# Patient Record
Sex: Female | Born: 1965 | Race: White | Hispanic: No | Smoking: Current every day smoker
Health system: Southern US, Community
[De-identification: ages and names within clinical notes are randomized; demographics above are authoritative.]

## PROBLEM LIST (undated history)

## (undated) DIAGNOSIS — F319 Bipolar disorder, unspecified: Secondary | ICD-10-CM

## (undated) DIAGNOSIS — E039 Hypothyroidism, unspecified: Secondary | ICD-10-CM

## (undated) DIAGNOSIS — F329 Major depressive disorder, single episode, unspecified: Secondary | ICD-10-CM

## (undated) DIAGNOSIS — K219 Gastro-esophageal reflux disease without esophagitis: Secondary | ICD-10-CM

## (undated) DIAGNOSIS — I10 Essential (primary) hypertension: Secondary | ICD-10-CM

## (undated) DIAGNOSIS — T8859XA Other complications of anesthesia, initial encounter: Secondary | ICD-10-CM

## (undated) DIAGNOSIS — C801 Malignant (primary) neoplasm, unspecified: Secondary | ICD-10-CM

## (undated) DIAGNOSIS — G5600 Carpal tunnel syndrome, unspecified upper limb: Secondary | ICD-10-CM

## (undated) DIAGNOSIS — C220 Liver cell carcinoma: Secondary | ICD-10-CM

## (undated) DIAGNOSIS — F419 Anxiety disorder, unspecified: Secondary | ICD-10-CM

## (undated) DIAGNOSIS — J449 Chronic obstructive pulmonary disease, unspecified: Secondary | ICD-10-CM

## (undated) DIAGNOSIS — R112 Nausea with vomiting, unspecified: Secondary | ICD-10-CM

## (undated) DIAGNOSIS — Z9889 Other specified postprocedural states: Secondary | ICD-10-CM

## (undated) DIAGNOSIS — F32A Depression, unspecified: Secondary | ICD-10-CM

## (undated) DIAGNOSIS — T4145XA Adverse effect of unspecified anesthetic, initial encounter: Secondary | ICD-10-CM

## (undated) DIAGNOSIS — F4310 Post-traumatic stress disorder, unspecified: Secondary | ICD-10-CM

## (undated) DIAGNOSIS — T300 Burn of unspecified body region, unspecified degree: Secondary | ICD-10-CM

## (undated) HISTORY — PX: LUNG SURGERY: SHX703

## (undated) HISTORY — PX: PORTA CATH INSERTION: CATH118285

## (undated) HISTORY — PX: OTHER SURGICAL HISTORY: SHX169

## (undated) HISTORY — DX: Chronic obstructive pulmonary disease, unspecified: J44.9

## (undated) HISTORY — DX: Gastro-esophageal reflux disease without esophagitis: K21.9

---

## 1999-03-19 ENCOUNTER — Encounter: Admission: RE | Admit: 1999-03-19 | Discharge: 1999-03-19 | Payer: Self-pay | Admitting: Family Medicine

## 1999-03-24 ENCOUNTER — Emergency Department (HOSPITAL_COMMUNITY): Admission: EM | Admit: 1999-03-24 | Discharge: 1999-03-25 | Payer: Self-pay | Admitting: *Deleted

## 1999-08-30 ENCOUNTER — Encounter: Admission: RE | Admit: 1999-08-30 | Discharge: 1999-08-30 | Payer: Self-pay | Admitting: Family Medicine

## 2000-11-18 ENCOUNTER — Emergency Department (HOSPITAL_COMMUNITY): Admission: EM | Admit: 2000-11-18 | Discharge: 2000-11-18 | Payer: Self-pay | Admitting: Emergency Medicine

## 2000-11-18 ENCOUNTER — Encounter: Payer: Self-pay | Admitting: Emergency Medicine

## 2000-12-11 ENCOUNTER — Encounter: Admission: RE | Admit: 2000-12-11 | Discharge: 2000-12-11 | Payer: Self-pay | Admitting: Family Medicine

## 2000-12-25 ENCOUNTER — Encounter: Admission: RE | Admit: 2000-12-25 | Discharge: 2000-12-25 | Payer: Self-pay | Admitting: Family Medicine

## 2001-06-08 ENCOUNTER — Emergency Department (HOSPITAL_COMMUNITY): Admission: EM | Admit: 2001-06-08 | Discharge: 2001-06-08 | Payer: Self-pay | Admitting: Emergency Medicine

## 2001-06-08 ENCOUNTER — Encounter: Payer: Self-pay | Admitting: Emergency Medicine

## 2001-06-10 ENCOUNTER — Encounter: Admission: RE | Admit: 2001-06-10 | Discharge: 2001-06-10 | Payer: Self-pay | Admitting: Family Medicine

## 2001-06-17 ENCOUNTER — Encounter: Admission: RE | Admit: 2001-06-17 | Discharge: 2001-06-17 | Payer: Self-pay | Admitting: Family Medicine

## 2001-12-13 ENCOUNTER — Encounter: Payer: Self-pay | Admitting: Emergency Medicine

## 2001-12-13 ENCOUNTER — Emergency Department (HOSPITAL_COMMUNITY): Admission: EM | Admit: 2001-12-13 | Discharge: 2001-12-13 | Payer: Self-pay | Admitting: Emergency Medicine

## 2003-06-10 ENCOUNTER — Emergency Department (HOSPITAL_COMMUNITY): Admission: EM | Admit: 2003-06-10 | Discharge: 2003-06-10 | Payer: Self-pay | Admitting: Emergency Medicine

## 2003-07-21 ENCOUNTER — Emergency Department (HOSPITAL_COMMUNITY): Admission: EM | Admit: 2003-07-21 | Discharge: 2003-07-21 | Payer: Self-pay | Admitting: Emergency Medicine

## 2003-08-06 ENCOUNTER — Emergency Department (HOSPITAL_COMMUNITY): Admission: EM | Admit: 2003-08-06 | Discharge: 2003-08-06 | Payer: Self-pay | Admitting: Emergency Medicine

## 2006-12-19 ENCOUNTER — Emergency Department (HOSPITAL_COMMUNITY): Admission: EM | Admit: 2006-12-19 | Discharge: 2006-12-19 | Payer: Self-pay | Admitting: Emergency Medicine

## 2007-01-13 ENCOUNTER — Emergency Department (HOSPITAL_COMMUNITY): Admission: EM | Admit: 2007-01-13 | Discharge: 2007-01-13 | Payer: Self-pay | Admitting: Emergency Medicine

## 2007-07-22 ENCOUNTER — Emergency Department (HOSPITAL_COMMUNITY): Admission: EM | Admit: 2007-07-22 | Discharge: 2007-07-22 | Payer: Self-pay | Admitting: Emergency Medicine

## 2008-05-18 ENCOUNTER — Emergency Department (HOSPITAL_COMMUNITY): Admission: EM | Admit: 2008-05-18 | Discharge: 2008-05-18 | Payer: Self-pay | Admitting: Emergency Medicine

## 2008-05-22 ENCOUNTER — Emergency Department (HOSPITAL_COMMUNITY): Admission: EM | Admit: 2008-05-22 | Discharge: 2008-05-22 | Payer: Self-pay | Admitting: Emergency Medicine

## 2008-11-10 ENCOUNTER — Emergency Department (HOSPITAL_COMMUNITY): Admission: EM | Admit: 2008-11-10 | Discharge: 2008-11-10 | Payer: Self-pay | Admitting: Emergency Medicine

## 2009-03-16 ENCOUNTER — Emergency Department (HOSPITAL_COMMUNITY): Admission: EM | Admit: 2009-03-16 | Discharge: 2009-03-16 | Payer: Self-pay | Admitting: Emergency Medicine

## 2010-04-26 ENCOUNTER — Emergency Department (HOSPITAL_COMMUNITY): Admission: EM | Admit: 2010-04-26 | Discharge: 2010-04-26 | Payer: Self-pay | Admitting: Emergency Medicine

## 2010-05-24 ENCOUNTER — Emergency Department (HOSPITAL_COMMUNITY): Admission: EM | Admit: 2010-05-24 | Discharge: 2010-05-24 | Payer: Self-pay | Admitting: Emergency Medicine

## 2010-10-23 LAB — URINALYSIS, ROUTINE W REFLEX MICROSCOPIC
Bilirubin Urine: NEGATIVE
Glucose, UA: NEGATIVE mg/dL
Urobilinogen, UA: 0.2 mg/dL (ref 0.0–1.0)

## 2010-10-23 LAB — CBC
MCH: 29 pg (ref 26.0–34.0)
MCHC: 32.6 g/dL (ref 30.0–36.0)
MCV: 89 fL (ref 78.0–100.0)
Platelets: 314 10*3/uL (ref 150–400)
RBC: 4.58 MIL/uL (ref 3.87–5.11)
WBC: 6.4 10*3/uL (ref 4.0–10.5)

## 2010-10-23 LAB — CLOSTRIDIUM DIFFICILE EIA: C difficile Toxins A+B, EIA: NEGATIVE

## 2010-10-23 LAB — LIPASE, BLOOD: Lipase: 25 U/L (ref 11–59)

## 2010-10-23 LAB — COMPREHENSIVE METABOLIC PANEL
ALT: 11 U/L (ref 0–35)
AST: 19 U/L (ref 0–37)
BUN: 7 mg/dL (ref 6–23)
CO2: 23 mEq/L (ref 19–32)
GFR calc non Af Amer: >60 mL/min (ref >60)
Glucose, Bld: 90 mg/dL (ref 70–99)
Potassium: 3.7 mEq/L (ref 3.5–5.1)

## 2010-10-23 LAB — DIFFERENTIAL
Basophils Absolute: 0 10*3/uL (ref 0.0–0.1)
Basophils Relative: 0 % (ref 0–1)
Eosinophils Absolute: 0.1 10*3/uL (ref 0.0–0.7)
Eosinophils Relative: 1 % (ref 0–5)
Lymphocytes Relative: 10 % — ABNORMAL LOW (ref 12–46)
Monocytes Relative: 6 % (ref 3–12)
Neutro Abs: 5.3 10*3/uL (ref 1.7–7.7)

## 2010-10-23 LAB — STOOL CULTURE

## 2010-10-23 LAB — OVA AND PARASITE EXAMINATION

## 2010-10-23 LAB — URINE CULTURE

## 2010-11-14 ENCOUNTER — Emergency Department (HOSPITAL_COMMUNITY): Payer: Self-pay

## 2010-11-14 ENCOUNTER — Emergency Department (HOSPITAL_COMMUNITY)
Admission: EM | Admit: 2010-11-14 | Discharge: 2010-11-14 | Disposition: A | Discharge status: Home / Self Care | Payer: Self-pay | Attending: Emergency Medicine | Admitting: Emergency Medicine

## 2010-11-14 DIAGNOSIS — R071 Chest pain on breathing: Secondary | ICD-10-CM | POA: Insufficient documentation

## 2010-11-14 DIAGNOSIS — Y939 Activity, unspecified: Secondary | ICD-10-CM | POA: Insufficient documentation

## 2010-11-14 DIAGNOSIS — W010XXA Fall on same level from slipping, tripping and stumbling without subsequent striking against object, initial encounter: Secondary | ICD-10-CM | POA: Insufficient documentation

## 2010-11-14 DIAGNOSIS — Y92009 Unspecified place in unspecified non-institutional (private) residence as the place of occurrence of the external cause: Secondary | ICD-10-CM | POA: Insufficient documentation

## 2010-11-14 DIAGNOSIS — I10 Essential (primary) hypertension: Secondary | ICD-10-CM | POA: Insufficient documentation

## 2010-11-14 DIAGNOSIS — F17200 Nicotine dependence, unspecified, uncomplicated: Secondary | ICD-10-CM | POA: Insufficient documentation

## 2010-11-14 DIAGNOSIS — J45909 Unspecified asthma, uncomplicated: Secondary | ICD-10-CM | POA: Insufficient documentation

## 2010-11-14 DIAGNOSIS — Y998 Other external cause status: Secondary | ICD-10-CM | POA: Insufficient documentation

## 2010-11-14 DIAGNOSIS — S20219A Contusion of unspecified front wall of thorax, initial encounter: Secondary | ICD-10-CM | POA: Insufficient documentation

## 2011-05-12 LAB — CBC
HCT: 36.3
Hemoglobin: 12.6
MCHC: 34.7
MCV: 97.7
RBC: 3.72 — ABNORMAL LOW
RDW: 13.2
WBC: 6

## 2011-05-12 LAB — COMPREHENSIVE METABOLIC PANEL
ALT: 13
AST: 18
Chloride: 107
GFR calc Af Amer: >60
GFR calc non Af Amer: >60
Potassium: 3.5
Total Protein: 6.3

## 2011-05-12 LAB — DIFFERENTIAL
Basophils Absolute: 0
Eosinophils Absolute: 0
Eosinophils Relative: 0
Lymphocytes Relative: 11 — ABNORMAL LOW
Neutro Abs: 4.9

## 2011-05-12 LAB — URINALYSIS, ROUTINE W REFLEX MICROSCOPIC
Bilirubin Urine: NEGATIVE
Hgb urine dipstick: NEGATIVE
Ketones, ur: 15 — AB
Nitrite: NEGATIVE
Protein, ur: NEGATIVE
pH: 6.5

## 2011-05-12 LAB — APTT: aPTT: 30

## 2011-09-25 ENCOUNTER — Emergency Department (HOSPITAL_COMMUNITY)
Admission: EM | Admit: 2011-09-25 | Discharge: 2011-09-25 | Disposition: A | Discharge status: Home / Self Care | Payer: Self-pay | Attending: Emergency Medicine | Admitting: Emergency Medicine

## 2011-09-25 ENCOUNTER — Encounter (HOSPITAL_COMMUNITY): Payer: Self-pay | Admitting: *Deleted

## 2011-09-25 DIAGNOSIS — M549 Dorsalgia, unspecified: Secondary | ICD-10-CM | POA: Insufficient documentation

## 2011-09-25 DIAGNOSIS — F17200 Nicotine dependence, unspecified, uncomplicated: Secondary | ICD-10-CM | POA: Insufficient documentation

## 2011-09-25 DIAGNOSIS — I10 Essential (primary) hypertension: Secondary | ICD-10-CM | POA: Insufficient documentation

## 2011-09-25 HISTORY — DX: Essential (primary) hypertension: I10

## 2011-09-25 LAB — URINALYSIS, ROUTINE W REFLEX MICROSCOPIC
Ketones, ur: NEGATIVE mg/dL
Nitrite: NEGATIVE
Protein, ur: NEGATIVE mg/dL
Urobilinogen, UA: 0.2 mg/dL (ref 0.0–1.0)

## 2011-09-25 MED ORDER — ONDANSETRON HCL 4 MG PO TABS
4.0000 mg | ORAL_TABLET | Freq: Once | ORAL | Status: AC
  Administered 2011-09-25: 4 mg via ORAL
  Filled 2011-09-25: qty 1

## 2011-09-25 MED ORDER — HYDROCODONE-ACETAMINOPHEN 5-325 MG PO TABS
1.0000 | ORAL_TABLET | Freq: Once | ORAL | Status: AC
  Administered 2011-09-25: 1 via ORAL
  Filled 2011-09-25: qty 1

## 2011-09-25 MED ORDER — PREDNISONE 20 MG PO TABS
60.0000 mg | ORAL_TABLET | Freq: Once | ORAL | Status: AC
  Administered 2011-09-25: 60 mg via ORAL
  Filled 2011-09-25: qty 3

## 2011-09-25 MED ORDER — HYDROCODONE-ACETAMINOPHEN 5-325 MG PO TABS: 1.0000 | ORAL_TABLET | ORAL | Status: AC | PRN

## 2011-09-25 MED ORDER — CYCLOBENZAPRINE HCL 10 MG PO TABS: 10.0000 mg | ORAL_TABLET | Freq: Two times a day (BID) | ORAL | Status: AC | PRN

## 2011-09-25 MED ORDER — METHOCARBAMOL 500 MG PO TABS
1000.0000 mg | ORAL_TABLET | Freq: Once | ORAL | Status: AC
  Administered 2011-09-25: 1000 mg via ORAL
  Filled 2011-09-25: qty 2

## 2011-09-25 MED ORDER — DEXAMETHASONE 4 MG PO TABS: ORAL_TABLET | ORAL | Status: AC

## 2011-09-25 NOTE — ED Notes (Signed)
Low backpain, no known injury  Increased pain with movement.
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
## 2011-09-25 NOTE — Discharge Instructions (Signed)
Please use the medications as prescribed. Please see Dr Shon Baton for evaluation and management of your back if not improving. Cyclobenzaprine and Norco may cause drowsiness, use with caution.Back Pain, Adult Low back pain is very common. About 1 in 5 people have back pain.The cause of low back pain is rarely dangerous. The pain often gets better over time.About half of people with a sudden onset of back pain feel better in just 2 weeks. About 8 in 10 people feel better by 6 weeks.  CAUSES Some common causes of back pain include:  Strain of the muscles or ligaments supporting the spine.   Wear and tear (degeneration) of the spinal discs.   Arthritis.   Direct injury to the back.  DIAGNOSIS Most of the time, the direct cause of low back pain is not known.However, back pain can be treated effectively even when the exact cause of the pain is unknown.Answering your caregiver's questions about your overall health and symptoms is one of the most accurate ways to make sure the cause of your pain is not dangerous. If your caregiver needs more information, he or she may order lab work or imaging tests (X-rays or MRIs).However, even if imaging tests show changes in your back, this usually does not require surgery. HOME CARE INSTRUCTIONS For many people, back pain returns.Since low back pain is rarely dangerous, it is often a condition that people can learn to [facility] their own.   Remain active. It is stressful on the back to sit or stand in one place. Do not sit, drive, or stand in one place for more than 30 minutes at a time. Take short walks on level surfaces as soon as pain allows.Try to increase the length of time you walk each day.   Do not stay in bed.Resting more than 1 or 2 days can delay your recovery.   Do not avoid exercise or work.Your body is made to move.It is not dangerous to be active, even though your back may hurt.Your back will likely heal faster if you return to being active  before your pain is gone.   Pay attention to your body when you bend and lift. Many people have less discomfortwhen lifting if they bend their knees, keep the load close to their bodies,and avoid twisting. Often, the most comfortable positions are those that put less stress on your recovering back.   Find a comfortable position to sleep. Use a firm mattress and lie on your side with your knees slightly bent. If you lie on your back, put a pillow under your knees.   Only take over-the-counter or prescription medicines as directed by your caregiver. Over-the-counter medicines to reduce pain and inflammation are often the most helpful.Your caregiver may prescribe muscle relaxant drugs.These medicines help dull your pain so you can more quickly return to your normal activities and healthy exercise.   Put ice on the injured area.   Put ice in a plastic bag.   Place a towel between your skin and the bag.   Leave the ice on for 15 to 20 minutes, 3 to 4 times a day for the first 2 to 3 days. After that, ice and heat may be alternated to reduce pain and spasms.   Ask your caregiver about trying back exercises and gentle massage. This may be of some benefit.   Avoid feeling anxious or stressed.Stress increases muscle tension and can worsen back pain.It is important to recognize when you are anxious or stressed and learn ways  to manage it.Exercise is a great option.  SEEK MEDICAL CARE IF:  You have pain that is not relieved with rest or medicine.   You have pain that does not improve in 1 week.   You have new symptoms.   You are generally not feeling well.  SEEK IMMEDIATE MEDICAL CARE IF:   You have pain that radiates from your back into your legs.   You develop new bowel or bladder control problems.   You have unusual weakness or numbness in your arms or legs.   You develop nausea or vomiting.   You develop abdominal pain.   You feel faint.  Document Released: 07/28/2005  Document Revised: 04/09/2011 Document Reviewed: 12/16/2010 [facility] Patient Information 2012 [place], [place].

## 2011-09-25 NOTE — ED Notes (Signed)
Pt DC to home in the care of family 

## 2011-09-27 NOTE — ED Provider Notes (Signed)
Medical screening examination/treatment/procedure(s) were performed by non-physician practitioner and as supervising physician I was immediately available for consultation/collaboration.  Nicoletta Dress. Colon Branch, MD 09/27/11 0800

## 2012-04-11 ENCOUNTER — Emergency Department (HOSPITAL_COMMUNITY): Payer: Self-pay

## 2012-04-11 ENCOUNTER — Encounter (HOSPITAL_COMMUNITY): Payer: Self-pay | Admitting: Emergency Medicine

## 2012-04-11 ENCOUNTER — Emergency Department (HOSPITAL_COMMUNITY)
Admission: EM | Admit: 2012-04-11 | Discharge: 2012-04-11 | Disposition: A | Discharge status: Home / Self Care | Payer: Self-pay | Attending: Emergency Medicine | Admitting: Emergency Medicine

## 2012-04-11 DIAGNOSIS — R0602 Shortness of breath: Secondary | ICD-10-CM | POA: Insufficient documentation

## 2012-04-11 DIAGNOSIS — F17200 Nicotine dependence, unspecified, uncomplicated: Secondary | ICD-10-CM | POA: Insufficient documentation

## 2012-04-11 DIAGNOSIS — J3489 Other specified disorders of nose and nasal sinuses: Secondary | ICD-10-CM | POA: Insufficient documentation

## 2012-04-11 DIAGNOSIS — Z79899 Other long term (current) drug therapy: Secondary | ICD-10-CM | POA: Insufficient documentation

## 2012-04-11 DIAGNOSIS — I10 Essential (primary) hypertension: Secondary | ICD-10-CM | POA: Insufficient documentation

## 2012-04-11 DIAGNOSIS — R05 Cough: Secondary | ICD-10-CM | POA: Insufficient documentation

## 2012-04-11 DIAGNOSIS — J209 Acute bronchitis, unspecified: Secondary | ICD-10-CM | POA: Insufficient documentation

## 2012-04-11 DIAGNOSIS — J449 Chronic obstructive pulmonary disease, unspecified: Secondary | ICD-10-CM

## 2012-04-11 DIAGNOSIS — R079 Chest pain, unspecified: Secondary | ICD-10-CM | POA: Insufficient documentation

## 2012-04-11 DIAGNOSIS — R059 Cough, unspecified: Secondary | ICD-10-CM | POA: Insufficient documentation

## 2012-04-11 DIAGNOSIS — J45909 Unspecified asthma, uncomplicated: Secondary | ICD-10-CM | POA: Insufficient documentation

## 2012-04-11 MED ORDER — ALBUTEROL SULFATE HFA 108 (90 BASE) MCG/ACT IN AERS
2.0000 | INHALATION_SPRAY | RESPIRATORY_TRACT | Status: DC | PRN
  Administered 2012-04-11: 2 via RESPIRATORY_TRACT
  Filled 2012-04-11: qty 6.7

## 2012-04-11 MED ORDER — ALBUTEROL SULFATE (5 MG/ML) 0.5% IN NEBU
5.0000 mg | INHALATION_SOLUTION | Freq: Once | RESPIRATORY_TRACT | Status: AC
  Administered 2012-04-11: 5 mg via RESPIRATORY_TRACT
  Filled 2012-04-11: qty 1

## 2012-04-11 MED ORDER — AZITHROMYCIN 250 MG PO TABS
500.0000 mg | ORAL_TABLET | Freq: Once | ORAL | Status: AC
  Administered 2012-04-11: 500 mg via ORAL
  Filled 2012-04-11: qty 2

## 2012-04-11 MED ORDER — PREDNISONE 20 MG PO TABS: ORAL_TABLET | ORAL | Status: DC

## 2012-04-11 MED ORDER — PREDNISONE 20 MG PO TABS
60.0000 mg | ORAL_TABLET | Freq: Once | ORAL | Status: AC
  Administered 2012-04-11: 60 mg via ORAL
  Filled 2012-04-11: qty 3

## 2012-04-11 MED ORDER — AZITHROMYCIN 250 MG PO TABS: ORAL_TABLET | ORAL | Status: DC

## 2012-04-11 MED ORDER — IPRATROPIUM BROMIDE 0.02 % IN SOLN
0.5000 mg | Freq: Once | RESPIRATORY_TRACT | Status: AC
  Administered 2012-04-11: 0.5 mg via RESPIRATORY_TRACT
  Filled 2012-04-11: qty 2.5

## 2012-04-11 MED ORDER — ALBUTEROL SULFATE HFA 108 (90 BASE) MCG/ACT IN AERS: 1.0000 | INHALATION_SPRAY | Freq: Four times a day (QID) | RESPIRATORY_TRACT | Status: DC | PRN
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
## 2012-04-11 NOTE — ED Notes (Signed)
Pt States has had increasing cough, nasal congestion x 1 week. Presents today with bilateral chest pain secondary to inspiration. Pt's lung fields with audible rales throughout. RTT notified of neb treatment need. Pt's CXR pending. Pt is alert, oriented x 4, side rails up per safety. Family at bedside.

## 2012-04-11 NOTE — ED Notes (Addendum)
Pt c/o cough/chest congestion x 1 week. Pt also c/o cp with inspiration. Pt states she is out of here albuterol inhaler.

## 2014-04-15 ENCOUNTER — Emergency Department (HOSPITAL_COMMUNITY): Payer: Self-pay

## 2014-04-15 ENCOUNTER — Encounter (HOSPITAL_COMMUNITY): Payer: Self-pay | Admitting: Emergency Medicine

## 2014-04-15 ENCOUNTER — Emergency Department (HOSPITAL_COMMUNITY)
Admission: EM | Admit: 2014-04-15 | Discharge: 2014-04-15 | Disposition: A | Discharge status: Home / Self Care | Payer: Self-pay | Attending: Emergency Medicine | Admitting: Emergency Medicine

## 2014-04-15 DIAGNOSIS — Y9389 Activity, other specified: Secondary | ICD-10-CM | POA: Insufficient documentation

## 2014-04-15 DIAGNOSIS — S4980XA Other specified injuries of shoulder and upper arm, unspecified arm, initial encounter: Secondary | ICD-10-CM | POA: Insufficient documentation

## 2014-04-15 DIAGNOSIS — J45909 Unspecified asthma, uncomplicated: Secondary | ICD-10-CM | POA: Insufficient documentation

## 2014-04-15 DIAGNOSIS — S60219A Contusion of unspecified wrist, initial encounter: Secondary | ICD-10-CM | POA: Insufficient documentation

## 2014-04-15 DIAGNOSIS — IMO0002 Reserved for concepts with insufficient information to code with codable children: Secondary | ICD-10-CM | POA: Insufficient documentation

## 2014-04-15 DIAGNOSIS — Z79899 Other long term (current) drug therapy: Secondary | ICD-10-CM | POA: Insufficient documentation

## 2014-04-15 DIAGNOSIS — S46909A Unspecified injury of unspecified muscle, fascia and tendon at shoulder and upper arm level, unspecified arm, initial encounter: Secondary | ICD-10-CM | POA: Insufficient documentation

## 2014-04-15 DIAGNOSIS — S60211A Contusion of right wrist, initial encounter: Secondary | ICD-10-CM

## 2014-04-15 DIAGNOSIS — Y9289 Other specified places as the place of occurrence of the external cause: Secondary | ICD-10-CM | POA: Insufficient documentation

## 2014-04-15 DIAGNOSIS — F17200 Nicotine dependence, unspecified, uncomplicated: Secondary | ICD-10-CM | POA: Insufficient documentation

## 2014-04-15 DIAGNOSIS — Y990 Civilian activity done for income or pay: Secondary | ICD-10-CM | POA: Insufficient documentation

## 2014-04-15 DIAGNOSIS — I10 Essential (primary) hypertension: Secondary | ICD-10-CM | POA: Insufficient documentation

## 2014-04-15 MED ORDER — NAPROXEN 500 MG PO TABS: 500.0000 mg | ORAL_TABLET | Freq: Two times a day (BID) | ORAL | Status: DC

## 2014-04-15 MED ORDER — IBUPROFEN 800 MG PO TABS
800.0000 mg | ORAL_TABLET | Freq: Once | ORAL | Status: AC
  Administered 2014-04-15: 800 mg via ORAL
  Filled 2014-04-15: qty 1

## 2014-04-15 NOTE — ED Notes (Addendum)
Pain to right wrist for a few days.  Remember hitting her right hand at work in the past week.

## 2014-04-15 NOTE — Discharge Instructions (Signed)
Xray normal - bruising - use meds as prescribed  Please call your doctor for a followup appointment within 24-48 hours. When you talk to your doctor please let them know that you were seen in the emergency department and have them acquire all of your records so that they can discuss the findings with you and formulate a treatment plan to fully care for your new and ongoing problems.   [facility] Primary Care Doctor List    Sinda Du MD. Specialty: Pulmonary Disease Contact information: [address]  [place] [address]  [phone]   Tula Nakayama, MD. Specialty: [facility] Medicine Contact information: [address], [address] [place] [address]  [phone]   Sallee Lange, MD. Specialty: [facility] Medicine Contact information: [place]  [place] [address]  [phone]   Rosita Fire, MD Specialty: Internal Medicine Contact information: [place] [address]  [phone]   Delphina Cahill, MD. Specialty: Internal Medicine Contact information: [place] [address]  [phone]   Marjean Donna, MD. Specialty: Family Medicine Contact information: [place] [address]  [phone]   Leslie Andrea, MD. Specialty: [facility] Medicine Contact information: [place] [address]  [phone]   Asencion Noble, MD. Specialty: Internal Medicine Contact information: [place] [address]  [phone]

## 2014-04-15 NOTE — ED Provider Notes (Signed)
CSN: [id number]     Arrival date & time 04/15/14  1136 History   First MD Initiated Contact with Patient 04/15/14 1143     Chief Complaint  Patient presents with  . Arm Injury     (Consider location/radiation/quality/duration/timing/severity/associated sxs/prior Treatment) HPI Comments: R wrist pain onset several days ago when she states that she accidentally banged her wrist on a metal tray at work, delayed sweling and pain - worsening, no numbness but has tingling in the fingers especially the thumb.  Sx are constant.  Patient is a 48 y.o. female presenting with arm injury.  Arm Injury Associated symptoms: no back pain and no neck pain     Past Medical History  Diagnosis Date  . Hypertension   . Asthma    Past Surgical History  Procedure Laterality Date  . Cesarean section     History reviewed. No pertinent family history. History  Substance Use Topics  . Smoking status: Current Every Day Smoker    Types: Cigarettes  . Smokeless tobacco: Not on file  . Alcohol Use: No   OB History   Grav Para Term Preterm Abortions TAB SAB Ect Mult Living                 Review of Systems  Gastrointestinal: Negative for nausea and vomiting.  Musculoskeletal: Positive for joint swelling (R wrist). Negative for back pain and neck pain.  Neurological: Negative for weakness and numbness.      Allergies  Codeine  Home Medications   Prior to Admission medications   Medication Sig Start Date End Date Taking? Authorizing Provider  acetaminophen (PAIN RELIEVER) 325 MG tablet Take 325-650 mg by mouth every 6 (six) hours as needed. FOR PAIN    Historical Provider, MD  albuterol (PROVENTIL HFA;VENTOLIN HFA) 108 (90 BASE) MCG/ACT inhaler Inhale 1-2 puffs into the lungs every 6 (six) hours as needed for wheezing. 04/11/12 04/11/13  Mylinda Latina, MD  albuterol (VENTOLIN HFA) 108 (90 BASE) MCG/ACT inhaler Inhale 2 puffs into the lungs every 6 (six) hours as needed. FOR SHORTNESS OF BREATH     Historical Provider, MD  Aspirin-Acetaminophen-Caffeine (GOODY HEADACHE PO) Take 1 Package by mouth 2 (two) times daily as needed. FOR PAIN    Historical Provider, MD  azithromycin (ZITHROMAX) 250 MG tablet Take one tablet per day for the next 4 days. 04/11/12   Mylinda Latina, MD  ibuprofen (ADVIL,MOTRIN) 200 MG tablet Take 800 mg by mouth every 8 (eight) hours as needed. FOR PAIN    Historical Provider, MD  naproxen (NAPROSYN) 500 MG tablet Take 1 tablet (500 mg total) by mouth 2 (two) times daily with a meal. 04/15/14   Johnna Acosta, MD  predniSONE (DELTASONE) 20 MG tablet Take 3 tablets per day for 2 days, then 2 tablets per day for 2 days, then 1 tablet per day for 2 days. 04/11/12   Mylinda Latina, MD   BP 163/90  Pulse 78  Temp(Src) 98.1 F (36.7 C) (Oral)  Resp 18  Ht 4\' 11"  (1.499 m)  Wt 73 lb 2 oz (33.169 kg)  BMI 14.76 kg/m2  SpO2 98% Physical Exam  Nursing note and vitals reviewed. Constitutional: She appears well-developed and well-nourished. No distress.  HENT:  Head: Normocephalic and atraumatic.  Eyes: Conjunctivae are normal. No scleral icterus.  Cardiovascular: Normal rate, regular rhythm and intact distal pulses.   Pulmonary/Chest: Effort normal and breath sounds normal.  Musculoskeletal: She exhibits tenderness ( R wrist over radial surface). She  exhibits no edema.  Neurological: She is alert.  Skin: Skin is warm and dry. No rash noted. She is not diaphoretic.    ED Course  Procedures (including critical care time) Labs Review Labs Reviewed - No data to display  Imaging Review Dg Wrist Complete Right  04/15/2014   CLINICAL DATA:  Right wrist pain/injury  EXAM: RIGHT WRIST - COMPLETE 3+ VIEW  COMPARISON:  None.  FINDINGS: No fracture or dislocation is seen.  The joint spaces are preserved.  Mild positive ulnar variance.  The visualized soft tissues are unremarkable.  IMPRESSION: No fracture or dislocation is seen.   Electronically Signed   By: Julian Hy M.D.    On: 04/15/2014 12:25      MDM   Final diagnoses:  Contusion of wrist, right, initial encounter    Imagine to r/o frx - does not appear deformed. nsaids   Xray neg,  velcro splint, RICE, home  Meds given in ED:  Medications  ibuprofen (ADVIL,MOTRIN) tablet 800 mg (800 mg Oral Given 04/15/14 1228)    New Prescriptions   NAPROXEN (NAPROSYN) 500 MG TABLET    Take 1 tablet (500 mg total) by mouth 2 (two) times daily with a meal.      Johnna Acosta, MD 04/15/14 1245

## 2014-04-15 NOTE — ED Notes (Signed)
Pain to right lower arm.  Do remember hitting arm at work on Sunday but didn't recall.  Took tylenol this am with no relief.

## 2014-05-13 ENCOUNTER — Emergency Department (HOSPITAL_COMMUNITY)
Admission: EM | Admit: 2014-05-13 | Discharge: 2014-05-14 | Disposition: A | Discharge status: Other Acute Inpatient Hospital | Payer: Self-pay | Attending: Emergency Medicine | Admitting: Emergency Medicine

## 2014-05-13 ENCOUNTER — Encounter (HOSPITAL_COMMUNITY): Payer: Self-pay | Admitting: Emergency Medicine

## 2014-05-13 DIAGNOSIS — Z720 Tobacco use: Secondary | ICD-10-CM | POA: Insufficient documentation

## 2014-05-13 DIAGNOSIS — R45851 Suicidal ideations: Secondary | ICD-10-CM | POA: Insufficient documentation

## 2014-05-13 DIAGNOSIS — F32A Depression, unspecified: Secondary | ICD-10-CM

## 2014-05-13 DIAGNOSIS — J45909 Unspecified asthma, uncomplicated: Secondary | ICD-10-CM | POA: Insufficient documentation

## 2014-05-13 DIAGNOSIS — Z880 Allergy status to penicillin: Secondary | ICD-10-CM | POA: Insufficient documentation

## 2014-05-13 DIAGNOSIS — F329 Major depressive disorder, single episode, unspecified: Secondary | ICD-10-CM

## 2014-05-13 DIAGNOSIS — I10 Essential (primary) hypertension: Secondary | ICD-10-CM | POA: Insufficient documentation

## 2014-05-13 DIAGNOSIS — Z791 Long term (current) use of non-steroidal anti-inflammatories (NSAID): Secondary | ICD-10-CM | POA: Insufficient documentation

## 2014-05-13 LAB — COMPREHENSIVE METABOLIC PANEL
ALBUMIN: 4 g/dL (ref 3.5–5.2)
ALK PHOS: 62 U/L (ref 39–117)
ALT: 10 U/L (ref 0–35)
ANION GAP: 11 (ref 5–15)
AST: 20 U/L (ref 0–37)
BILIRUBIN TOTAL: 0.3 mg/dL (ref 0.3–1.2)
BUN: 12 mg/dL (ref 6–23)
CHLORIDE: 106 meq/L (ref 96–112)
CO2: 24 meq/L (ref 19–32)
Calcium: 9.5 mg/dL (ref 8.4–10.5)
Creatinine, Ser: 0.49 mg/dL — ABNORMAL LOW (ref 0.50–1.10)
GFR calc Af Amer: >90 mL/min (ref >90)
GLUCOSE: 98 mg/dL (ref 70–99)
POTASSIUM: 3.9 meq/L (ref 3.7–5.3)
Sodium: 141 mEq/L (ref 137–147)
Total Protein: 7.4 g/dL (ref 6.0–8.3)

## 2014-05-13 LAB — TROPONIN I: Troponin I: <0.3 ng/mL (ref <0.30)

## 2014-05-13 LAB — CBC
HEMATOCRIT: 41.8 % (ref 36.0–46.0)
HEMOGLOBIN: 14.6 g/dL (ref 12.0–15.0)
MCH: 33.2 pg (ref 26.0–34.0)
MCHC: 34.9 g/dL (ref 30.0–36.0)
MCV: 95 fL (ref 78.0–100.0)
Platelets: 259 10*3/uL (ref 150–400)
RBC: 4.4 MIL/uL (ref 3.87–5.11)
RDW: 12.8 % (ref 11.5–15.5)
WBC: 5 10*3/uL (ref 4.0–10.5)

## 2014-05-13 LAB — SALICYLATE LEVEL: Salicylate Lvl: <2 mg/dL — ABNORMAL LOW (ref 2.8–20.0)

## 2014-05-13 LAB — RAPID URINE DRUG SCREEN, HOSP PERFORMED
Amphetamines: NOT DETECTED
BARBITURATES: NOT DETECTED
Benzodiazepines: NOT DETECTED
COCAINE: NOT DETECTED
Opiates: NOT DETECTED
TETRAHYDROCANNABINOL: POSITIVE — AB

## 2014-05-13 LAB — ETHANOL: Alcohol, Ethyl (B): <11 mg/dL (ref 0–11)

## 2014-05-13 LAB — ACETAMINOPHEN LEVEL

## 2014-05-13 MED ORDER — LORAZEPAM 0.5 MG PO TABS
0.5000 mg | ORAL_TABLET | Freq: Once | ORAL | Status: AC
  Administered 2014-05-13: 0.5 mg via ORAL
  Filled 2014-05-13: qty 1

## 2014-05-13 MED ORDER — NICOTINE 21 MG/24HR TD PT24
21.0000 mg | MEDICATED_PATCH | Freq: Every day | TRANSDERMAL | Status: DC
  Administered 2014-05-13: 21 mg via TRANSDERMAL
  Filled 2014-05-13: qty 1
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
## 2014-05-13 NOTE — ED Notes (Signed)
Pt allowed to use phone to call her daughter.  
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
## 2014-05-13 NOTE — BH Assessment (Signed)
Received call for assessment. Spoke with Riki Altes, MD who said Pt had an argument with her daughter tonight and is crying and suicidal. She has lost 20 lbs recently and only weighs 70 pounds. Tele-assessment will be initiated.  Orpah Greek Rosana Hoes, [facility] Triage Specialist [phone]

## 2014-05-13 NOTE — ED Notes (Signed)
telepsych in progress 

## 2014-05-14 ENCOUNTER — Encounter (HOSPITAL_COMMUNITY): Payer: Self-pay

## 2014-05-14 ENCOUNTER — Inpatient Hospital Stay (HOSPITAL_COMMUNITY)
Admission: AD | Admit: 2014-05-14 | Discharge: 2014-05-18 | DRG: 885 | Disposition: A | Discharge status: Home / Self Care | Payer: 59 | Source: Intra-hospital | Attending: Psychiatry | Admitting: Psychiatry

## 2014-05-14 DIAGNOSIS — J449 Chronic obstructive pulmonary disease, unspecified: Secondary | ICD-10-CM | POA: Diagnosis present

## 2014-05-14 DIAGNOSIS — I10 Essential (primary) hypertension: Secondary | ICD-10-CM | POA: Diagnosis present

## 2014-05-14 DIAGNOSIS — F32A Depression, unspecified: Secondary | ICD-10-CM

## 2014-05-14 DIAGNOSIS — F39 Unspecified mood [affective] disorder: Secondary | ICD-10-CM | POA: Diagnosis present

## 2014-05-14 DIAGNOSIS — F1220 Cannabis dependence, uncomplicated: Secondary | ICD-10-CM | POA: Diagnosis present

## 2014-05-14 DIAGNOSIS — Z599 Problem related to housing and economic circumstances, unspecified: Secondary | ICD-10-CM

## 2014-05-14 DIAGNOSIS — F322 Major depressive disorder, single episode, severe without psychotic features: Secondary | ICD-10-CM

## 2014-05-14 DIAGNOSIS — F17210 Nicotine dependence, cigarettes, uncomplicated: Secondary | ICD-10-CM | POA: Diagnosis present

## 2014-05-14 DIAGNOSIS — F332 Major depressive disorder, recurrent severe without psychotic features: Secondary | ICD-10-CM | POA: Diagnosis present

## 2014-05-14 DIAGNOSIS — R634 Abnormal weight loss: Secondary | ICD-10-CM

## 2014-05-14 DIAGNOSIS — Z809 Family history of malignant neoplasm, unspecified: Secondary | ICD-10-CM | POA: Diagnosis not present

## 2014-05-14 DIAGNOSIS — J45909 Unspecified asthma, uncomplicated: Secondary | ICD-10-CM | POA: Diagnosis present

## 2014-05-14 DIAGNOSIS — F419 Anxiety disorder, unspecified: Secondary | ICD-10-CM | POA: Diagnosis present

## 2014-05-14 DIAGNOSIS — G4700 Insomnia, unspecified: Secondary | ICD-10-CM | POA: Diagnosis present

## 2014-05-14 DIAGNOSIS — Z23 Encounter for immunization: Secondary | ICD-10-CM | POA: Diagnosis not present

## 2014-05-14 DIAGNOSIS — K219 Gastro-esophageal reflux disease without esophagitis: Secondary | ICD-10-CM | POA: Diagnosis present

## 2014-05-14 DIAGNOSIS — F329 Major depressive disorder, single episode, unspecified: Secondary | ICD-10-CM | POA: Diagnosis present

## 2014-05-14 HISTORY — DX: Major depressive disorder, single episode, unspecified: F32.9

## 2014-05-14 HISTORY — DX: Anxiety disorder, unspecified: F41.9

## 2014-05-14 HISTORY — DX: Depression, unspecified: F32.A

## 2014-05-14 MED ORDER — TRAZODONE HCL 50 MG PO TABS
50.0000 mg | ORAL_TABLET | Freq: Every day | ORAL | Status: DC
  Administered 2014-05-14 – 2014-05-17 (×4): 50 mg via ORAL
  Filled 2014-05-14 (×7): qty 1

## 2014-05-14 MED ORDER — PNEUMOCOCCAL VAC POLYVALENT 25 MCG/0.5ML IJ INJ
0.5000 mL | INJECTION | INTRAMUSCULAR | Status: AC
  Administered 2014-05-15: 0.5 mL via INTRAMUSCULAR

## 2014-05-14 MED ORDER — LISINOPRIL 5 MG PO TABS: 5.0000 mg | ORAL_TABLET | Freq: Every day | ORAL | Status: DC

## 2014-05-14 MED ORDER — MIRTAZAPINE 7.5 MG PO TABS
7.5000 mg | ORAL_TABLET | Freq: Every day | ORAL | Status: DC
  Filled 2014-05-14 (×2): qty 1

## 2014-05-14 MED ORDER — NICOTINE 21 MG/24HR TD PT24
21.0000 mg | MEDICATED_PATCH | Freq: Every day | TRANSDERMAL | Status: DC
  Administered 2014-05-14 – 2014-05-18 (×5): 21 mg via TRANSDERMAL
  Filled 2014-05-14 (×10): qty 1

## 2014-05-14 MED ORDER — ALUM & MAG HYDROXIDE-SIMETH 200-200-20 MG/5ML PO SUSP
30.0000 mL | ORAL | Status: DC | PRN
  Administered 2014-05-17: 30 mL via ORAL

## 2014-05-14 MED ORDER — ENSURE COMPLETE PO LIQD
237.0000 mL | Freq: Three times a day (TID) | ORAL | Status: DC
  Administered 2014-05-14 – 2014-05-18 (×12): 237 mL via ORAL

## 2014-05-14 MED ORDER — INFLUENZA VAC SPLIT QUAD 0.5 ML IM SUSY
0.5000 mL | PREFILLED_SYRINGE | INTRAMUSCULAR | Status: AC
  Administered 2014-05-15: 0.5 mL via INTRAMUSCULAR
  Filled 2014-05-14: qty 0.5

## 2014-05-14 MED ORDER — MIRTAZAPINE 15 MG PO TABS
15.0000 mg | ORAL_TABLET | Freq: Every day | ORAL | Status: DC
  Administered 2014-05-14: 15 mg via ORAL
  Filled 2014-05-14 (×3): qty 1

## 2014-05-14 MED ORDER — HYDROXYZINE HCL 25 MG PO TABS
25.0000 mg | ORAL_TABLET | Freq: Four times a day (QID) | ORAL | Status: DC | PRN
  Administered 2014-05-14 – 2014-05-17 (×6): 25 mg via ORAL
  Filled 2014-05-14: qty 40
  Filled 2014-05-14 (×6): qty 1

## 2014-05-14 MED ORDER — MAGNESIUM HYDROXIDE 400 MG/5ML PO SUSP: 30.0000 mL | Freq: Every day | ORAL | Status: DC | PRN

## 2014-05-14 MED ORDER — BACITRACIN-NEOMYCIN-POLYMYXIN 400-5-5000 EX OINT
TOPICAL_OINTMENT | CUTANEOUS | Status: DC | PRN
  Administered 2014-05-14: 1 via TOPICAL
  Filled 2014-05-14: qty 1

## 2014-05-14 MED ORDER — LISINOPRIL 5 MG PO TABS
5.0000 mg | ORAL_TABLET | Freq: Every day | ORAL | Status: DC
  Administered 2014-05-14 – 2014-05-18 (×4): 5 mg via ORAL
  Filled 2014-05-14 (×7): qty 1
  Filled 2014-05-14: qty 14
  Filled 2014-05-14: qty 1

## 2014-05-14 MED ORDER — ACETAMINOPHEN 325 MG PO TABS
650.0000 mg | ORAL_TABLET | Freq: Four times a day (QID) | ORAL | Status: DC | PRN
  Administered 2014-05-14 (×2): 650 mg via ORAL
  Filled 2014-05-14 (×2): qty 2

## 2014-05-14 NOTE — BHH Group Notes (Signed)
[place] Group Notes:  (Clinical Social Work)  05/14/2014  10:00-11:00AM  Summary of Progress/Problems:   The main focus of today's process group was to   1)  discuss the importance of adding supports  2)  define health supports versus unhealthy supports  3)  identify the patient's current unhealthy supports and plan how to handle them  4)  Identify the patient's current healthy supports and plan what to add.  An emphasis was placed on using counselor, doctor, therapy groups, 12-step groups, and problem-specific support groups to expand supports.    The patient expressed full comprehension of the concepts presented, and agreed that there is a need to add more supports.  The patient was extremely tearful throughout group and was supported heavily by a number of other group members.  She became monopolizing and difficult to redirect, kept getting off topic.  She was labile, and after group became incensed that her daughter and workplace had not yet been contacted.  She was very focused on recent events, and expressed hopelessness throughout session.  Type of Therapy:  Process Group with Motivational Interviewing  Participation Level:  Active  Participation Quality:  Monopolizing  Affect:  Depressed, Labile and Tearful  Cognitive:  Oriented  Insight:  Limited  Engagement in Therapy:  Engaged  Modes of Intervention:   Education, Support and Processing, Activity  [employer], LCSW 05/14/2014, 12:15pm

## 2014-05-14 NOTE — Tx Team (Signed)
Initial Interdisciplinary Treatment Plan   PATIENT STRESSORS: Financial difficulties Health problems Loss of spouse Marital or family conflict Medication change or noncompliance   PROBLEM LIST: Problem List/Patient Goals Date to be addressed Date deferred Reason deferred Estimated date of resolution  Risk for suicide 05/14/14     Weight loss 05/14/14     Depression 05/14/14     Affording medications 05/14/14     Marriage problems 05/14/14                              DISCHARGE CRITERIA:  Ability to meet basic life and health needs Improved stabilization in mood, thinking, and/or behavior Verbal commitment to aftercare and medication compliance  PRELIMINARY DISCHARGE PLAN: Attend aftercare/continuing care group Outpatient therapy  PATIENT/FAMIILY INVOLVEMENT: This treatment plan has been presented to and reviewed with the patient, Jamie Salinas.  The patient and family have been given the opportunity to ask questions and make suggestions.  Vonzella Nipple A 05/14/2014, 3:17 AM

## 2014-05-14 NOTE — Progress Notes (Signed)
Patient did attend the evening speaker AA meeting.  

## 2014-05-14 NOTE — ED Notes (Signed)
Pt escorted to transport with belongings. Pt daughter had returned w/ food for pt. Pt ate meal while in waiting room before leaving. Security remained w/ pt.
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
## 2014-05-14 NOTE — Clinical Social Work Note (Signed)
Clinical Social Work Note  At patient request and with her written consent, called her employer [employer] at [phone], to inform him that she is in the hospital at [facility] and will not be able to work tomorrow.  He stated this was taken care of by her daughter, and that he just wants her to get better.  He stated to tell her that her job is not in jeopardy.  Selmer Dominion, LCSW 05/14/2014, 4:19 PM
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
## 2014-05-14 NOTE — BHH Suicide Risk Assessment (Signed)
Suicide Risk Assessment  Admission Assessment     Nursing information obtained from:    Demographic factors:    Current Mental Status:    Loss Factors:    Historical Factors:    Risk Reduction Factors:    Total Time spent with patient: 20 minutes  CLINICAL FACTORS:   Severe Anxiety and/or Agitation Depression:   Anhedonia Comorbid alcohol abuse/dependence Hopelessness Impulsivity Insomnia Severe Alcohol/Substance Abuse/Dependencies Currently Psychotic  Psychiatric Specialty Exam:     Blood pressure 116/81, pulse 87, temperature 98.2 F (36.8 C), temperature source Oral, resp. rate 18, height 4\' 10"  (1.473 m), weight 31.752 kg (70 lb), last menstrual period 03/09/2012.Body mass index is 14.63 kg/(m^2).  General Appearance: Disheveled  Eye Contact::  Poor  Speech:  Clear and Coherent and Slow  Volume:  Decreased  Mood:  Anxious and Depressed  Affect:  Constricted  Thought Process:  Goal Directed  Orientation:  Full (Time, Place, and Person)  Thought Content:  Hallucinations: Auditory  Suicidal Thoughts:  Yes.  with intent/plan  Homicidal Thoughts:  No  Memory:  Immediate;   Fair Recent;   Fair Remote;   Fair  Judgement:  Impaired  Insight:  Shallow  Psychomotor Activity:  Increased  Concentration:  Fair  Recall:  [employer] of Knowledge:Good  Language: Good  Akathisia:  No  Handed:  Right  AIMS (if indicated):     Assets:  Communication Skills Desire for Improvement Physical Health  Sleep:  Number of Hours: 2.5 (New admit )   Musculoskeletal: Strength & Muscle Tone: within normal limits Gait & Station: normal Patient leans: N/A  COGNITIVE FEATURES THAT CONTRIBUTE TO RISK:  Closed-mindedness    SUICIDE RISK:   Mild:  Suicidal ideation of limited frequency, intensity, duration, and specificity.  There are no identifiable plans, no associated intent, mild dysphoria and related symptoms, good self-control (both objective and subjective assessment), few  other risk factors, and identifiable protective factors, including available and accessible social support.  PLAN OF CARE:1. Admit for crisis management and stabilization. 2. Medication management to reduce current symptoms to base line and improve the  patient's overall level of functioning 3. Treat health problems as indicated. 4. Develop treatment plan to decrease risk of relapse upon discharge and the need for readmission. 5. Psycho-social education regarding relapse prevention and self care. 6. Health care follow up as needed for medical problems. 7. Restart home medications where appropriate.   I certify that inpatient services furnished can reasonably be expected to improve the patient's condition.  Corena Pilgrim, MD 05/14/2014, 10:23 AM
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
## 2014-05-14 NOTE — Progress Notes (Signed)
Patient ID: Jamie Salinas, female   DOB: 21-Apr-1966, 48 y.o.   MRN: [id number]   D: Pt has been very flat and depressed on the unit today, patient has also been very tearful. Pt attended groups and engaged in treatment, however remained very tearful. Pt reported concerns about her job, and was requesting a letter for work. Pt was referred to SW for assistance.  Pt reported being positive SI, however contracted for safety. Pt reported being negative HI, no AH/VH noted. A: 15 min checks continued for patient safety. R: Pt safety maintained.

## 2014-05-14 NOTE — BHH Group Notes (Signed)
[facility] Group Notes:  (Nursing/MHT/Case Management/Adjunct)  Date:  05/14/2014  Time:  5:57 PM  Type of Therapy:  Nurse Education  Participation Level:  Active  Participation Quality:  Sharing  Affect:  Anxious and Tearful  Cognitive:  Alert and Appropriate  Insight:  Good  Engagement in Group:  Engaged  Modes of Intervention:  Activity, Discussion and Education  Summary of Progress/Problems:Pt was tearful during group and was questioning when she would be started on medications.  She did leave group early to talk with the provider.  She did share her daughter was her support.   Karel Jarvis 05/14/2014, 5:57 PM

## 2014-05-14 NOTE — BHH Group Notes (Signed)
[facility] Group Notes:  (Nursing/MHT/Case Management/Adjunct)  Date:  05/14/2014  Time:  3:57 PM  Type of Therapy:  Psychoeducational Skills  Participation Level:  Active  Participation Quality:  Appropriate  Affect:  Appropriate  Cognitive:  Appropriate  Insight:  Appropriate  Engagement in Group:  Engaged  Modes of Intervention:  Discussion  Summary of Progress/Problems: Pt did attend self inventory group, pt reported that she was negative SI/HI, no AH/VH noted. Pt rated her depression as a 10, and her helplessness/hopelessness as a 10.     Pt reported no issues or concerns, except for needing to see doctor.  Benancio Deeds Shanta 05/14/2014, 3:57 PM

## 2014-05-15 ENCOUNTER — Ambulatory Visit (HOSPITAL_COMMUNITY)
Admission: AD | Admit: 2014-05-15 | Discharge: 2014-05-15 | Disposition: A | Discharge status: Home / Self Care | Payer: 59 | Source: Intra-hospital | Attending: Psychiatry | Admitting: Psychiatry

## 2014-05-15 DIAGNOSIS — F329 Major depressive disorder, single episode, unspecified: Secondary | ICD-10-CM

## 2014-05-15 DIAGNOSIS — F322 Major depressive disorder, single episode, severe without psychotic features: Secondary | ICD-10-CM

## 2014-05-15 DIAGNOSIS — R634 Abnormal weight loss: Secondary | ICD-10-CM | POA: Diagnosis present

## 2014-05-15 DIAGNOSIS — F1210 Cannabis abuse, uncomplicated: Secondary | ICD-10-CM

## 2014-05-15 LAB — LACTATE DEHYDROGENASE: LDH: 188 U/L (ref 94–250)

## 2014-05-15 LAB — SEDIMENTATION RATE: SED RATE: 21 mm/h (ref 0–22)

## 2014-05-15 MED ORDER — IBUPROFEN 200 MG PO TABS
400.0000 mg | ORAL_TABLET | Freq: Four times a day (QID) | ORAL | Status: DC | PRN
  Administered 2014-05-15 – 2014-05-16 (×3): 400 mg via ORAL
  Filled 2014-05-15 (×4): qty 2

## 2014-05-15 MED ORDER — MIRTAZAPINE 30 MG PO TABS
30.0000 mg | ORAL_TABLET | Freq: Every day | ORAL | Status: DC
  Administered 2014-05-15 – 2014-05-17 (×3): 30 mg via ORAL
  Filled 2014-05-15: qty 14
  Filled 2014-05-15 (×4): qty 1

## 2014-05-15 MED ORDER — HYDROCORTISONE 1 % EX CREA
TOPICAL_CREAM | Freq: Two times a day (BID) | CUTANEOUS | Status: DC
  Administered 2014-05-15 – 2014-05-16 (×3): via TOPICAL
  Filled 2014-05-15: qty 28

## 2014-05-15 NOTE — Progress Notes (Addendum)
NUTRITION ASSESSMENT  Pt identified as at risk on the Malnutrition Screen Tool  INTERVENTION: 1. Educated patient on the importance of nutrition and encouraged intake of food and beverages. 2. Discussed weight goals. 3. Supplements: Ensure Complete po TID, each supplement provides 350 kcal and 13 grams of protein,   Coupons provided with recommendations to continue at home.   NUTRITION DIAGNOSIS: Unintentional weight loss related to sub-optimal intake as evidenced by pt report.   Goal: Pt to meet >/= 90% of their estimated nutrition needs.  Monitor:  PO intake  Assessment:  Patient admitted with depression.  Patient reports that her appetite is increasing adn she is eating better here.  Prior to admit she would skip breakfast and lunch.  She worked first shift at [employer] since her daughter was 5 and rather than eat on breaks, she would smoke.  She would also prioritize money to smoking.  Husband moved out at the beginning of August.  When he lived there she cooked a full dinner but still ate poorly.  Now eats Ooodles of noodles and snack foods for dinner.  UBW 85-90 lbs 2 months ago.  At least a 15 lb (18%) weight loss in the past 2 months per patient.  Patient drank Boost or Ensure at home occasionally.  Drinks the Ensure here.  Patient meets criteria for severe malnutrition related to social/environmental causes AEB 18% weight loss in the past 2 months and severe depletion of muscle mass and body weight.  48 y.o. female  Height: Ht Readings from Last 1 Encounters:  05/14/14 4\' 10"  (1.473 m)    Weight: Wt Readings from Last 1 Encounters:  05/14/14 70 lb (31.752 kg)    Weight Hx: Wt Readings from Last 10 Encounters:  05/14/14 70 lb (31.752 kg)  05/13/14 75 lb (34.02 kg)  04/15/14 73 lb 2 oz (33.169 kg)  04/11/12 85 lb (38.556 kg)  09/25/11 90 lb (40.824 kg)    BMI:  Body mass index is 14.63 kg/(m^2). Pt meets criteria for underweight based on current BMI.  Estimated  Nutritional Needs: Kcal: 25-30 kcal/kg Protein: > 1 gram protein/kg Fluid: 1 ml/kcal  Diet Order: General Pt is also offered choice of unit snacks mid-morning and mid-afternoon.  Pt is eating as desired.   Lab results and medications reviewed.   Antonieta Iba, RD, LDN Clinical Inpatient Dietitian Pager:  [phone] Weekend and after hours pager:  [phone]

## 2014-05-15 NOTE — Progress Notes (Signed)
D: Pt denies SI/HI/AVH. Pt is pleasant and cooperative. Pt tearful and upset that she did not see the dr until 4 pm.   A: Pt was offered support and encouragement. Pt was given scheduled medications. Pt was encourage to attend groups. Q 15 minute checks were done for safety.   R:Pt attends groups and interacts well with peers and staff. Pt is taking medication. Pt has no complaints.Pt receptive to treatment and safety maintained on unit.

## 2014-05-15 NOTE — Progress Notes (Signed)
Adult Psychoeducational Group Note  Date:  05/15/2014 Time:  10:00am Group Topic/Focus:  Therapeutic acitivity  Participation Level:  Did Not Attend  Participation Quality:    Affect:   Cognitive:    Insight:   Engagement in Group:    Modes of Intervention:   Additional Comments:  Did not attend   Marlowe Shores D 05/15/2014, 11:41 AM

## 2014-05-15 NOTE — Progress Notes (Signed)
[facility] MD Progress Note  05/15/2014 12:13 PM Jamie Salinas  MRN:  [id number] Subjective: Patient states she feels depressed . She also describes significant R wrist pain. Objective: Patient is depressed, sad, and intermittently tearful, although she does respond to support and empathy, ans smiles briefly at times. She denies any suicidal ideations at this time.  She describes multiple psychosocial stressors, to inlcude recent separation from husband and financial difficulties. She states her adult daughter is her closest support. She states she has " always been small, but I have lost a lot of weight", which she attributes to not eating well due to depression and to her metabolism. She appears undernourished. Her labs, however, are unremarkable. On unit tends to isolate, and group attendance has been limited.  No disruptive behaviors on unit. At this time on Remeron trial and tolerating it well.  We reviewed side effects and rationale for this medication.  Diagnosis:  MDD without psychotic features. Cannabis Abuse   Total Time spent with patient: 25 minutes    ADL's:  Impaired  Sleep: Fair  Appetite:  Fair  Suicidal Ideation:  Denies any current suicidal ideations Homicidal Ideation:  Denies any current homicidal ideations  AEB (as evidenced by):  Psychiatric Specialty Exam: Physical Exam  Review of Systems  Constitutional: Positive for weight loss. Negative for fever and chills.       Describes night sweats, which she attributes to " going through menopause".   HENT: Negative.        Poor dentition  Respiratory: Negative for cough, hemoptysis, sputum production and shortness of breath.   Cardiovascular: Negative for chest pain.  Gastrointestinal: Negative for nausea and vomiting.  Musculoskeletal: Negative.        R wrist pain- of note, recent X ray negative for fracture/luxations. Does have localized swelling.   Skin: Negative for rash.  Psychiatric/Behavioral: Positive  for depression and substance abuse. Negative for suicidal ideas and hallucinations.    Blood pressure 89/60, pulse 90, temperature 97.9 F (36.6 C), temperature source Oral, resp. rate 20, height $RemoveBe'4\' 10"'JaxTiuwsf$  (1.473 m), weight 31.752 kg (70 lb), last menstrual period 03/09/2012.Body mass index is 14.63 kg/(m^2).  General Appearance: Fairly Groomed- appears under nourished   Eye Contact::  Good  Speech:  Normal Rate  Volume:  Normal  Mood:  Depressed  Affect:  Constricted and Tearful, but reactive  Thought Process:  Goal Directed  Orientation:  Other:  fully alert and attentive  Thought Content:  no hallucinations, no delusions  Suicidal Thoughts:  No- at this time denies any active suicidal ideations and contracts for safety on the unit  Homicidal Thoughts:  No  Memory:  recent and remote grossly intact   Judgement:  Fair  Insight:  Fair  Psychomotor Activity:  Normal  Concentration:  Fair  Recall:  Good  Fund of Knowledge:Good  Language: Good  Akathisia:  No  Handed:  Right  AIMS (if indicated):     Assets:  Communication Skills Desire for Improvement Resilience Social Support  Sleep:  Number of Hours: 4.75   Musculoskeletal: Strength & Muscle Tone: within normal limits Gait & Station: normal Patient leans: N/A  Current Medications: Current Facility-Administered Medications  Medication Dose Route Frequency Provider Last Rate Last Dose  . acetaminophen (TYLENOL) tablet 650 mg  650 mg Oral Q6H PRN Neita Garnet, MD   650 mg at 05/14/14 2125  . alum & mag hydroxide-simeth (MAALOX/MYLANTA) 200-200-20 MG/5ML suspension 30 mL  30 mL Oral Q4H PRN Neita Garnet,  MD      . feeding supplement (ENSURE COMPLETE) (ENSURE COMPLETE) liquid 237 mL  237 mL Oral TID WC Mojeed Akintayo   237 mL at 05/15/14 0820  . hydrOXYzine (ATARAX/VISTARIL) tablet 25 mg  25 mg Oral Q6H PRN Neita Garnet, MD   25 mg at 05/14/14 1728  . lisinopril (PRINIVIL,ZESTRIL) tablet 5 mg  5 mg Oral Daily Encarnacion Slates,  NP   5 mg at 05/14/14 0240  . magnesium hydroxide (MILK OF MAGNESIA) suspension 30 mL  30 mL Oral Daily PRN Neita Garnet, MD      . mirtazapine (REMERON) tablet 15 mg  15 mg Oral QHS Delfin Gant, NP   15 mg at 05/14/14 2125  . neomycin-bacitracin-polymyxin (NEOSPORIN) ointment   Topical PRN Delfin Gant, NP   1 application at 47/42/59 1820  . nicotine (NICODERM CQ - dosed in mg/24 hours) patch 21 mg  21 mg Transdermal Q0600 Neita Garnet, MD   21 mg at 05/15/14 0819  . traZODone (DESYREL) tablet 50 mg  50 mg Oral QHS Delfin Gant, NP   50 mg at 05/14/14 2125    Lab Results:  Results for orders placed during the hospital encounter of 05/13/14 (from the past 48 hour(s))  URINE RAPID DRUG SCREEN (HOSP PERFORMED)     Status: Abnormal   Collection Time    05/13/14  9:45 PM      Result Value Ref Range   Opiates NONE DETECTED  NONE DETECTED   Cocaine NONE DETECTED  NONE DETECTED   Benzodiazepines NONE DETECTED  NONE DETECTED   Amphetamines NONE DETECTED  NONE DETECTED   Tetrahydrocannabinol POSITIVE (*) NONE DETECTED   Barbiturates NONE DETECTED  NONE DETECTED   Comment:            DRUG SCREEN FOR MEDICAL PURPOSES     ONLY.  IF CONFIRMATION IS NEEDED     FOR ANY PURPOSE, NOTIFY LAB     WITHIN 5 DAYS.                LOWEST DETECTABLE LIMITS     FOR URINE DRUG SCREEN     Drug Class       Cutoff (ng/mL)     Amphetamine      1000     Barbiturate      200     Benzodiazepine   563     Tricyclics       875     Opiates          300     Cocaine          300     THC              50  TROPONIN I     Status: None   Collection Time    05/13/14 10:18 PM      Result Value Ref Range   Troponin I <0.30  <0.30 ng/mL   Comment:            Due to the release kinetics of cTnI,     a negative result within the first hours     of the onset of symptoms does not rule out     myocardial infarction with certainty.     If myocardial infarction is still suspected,     repeat the test  at appropriate intervals.  ACETAMINOPHEN LEVEL     Status: None   Collection Time    05/13/14 10:25  PM      Result Value Ref Range   Acetaminophen (Tylenol), Serum <15.0  10 - 30 ug/mL   Comment:            THERAPEUTIC CONCENTRATIONS VARY     SIGNIFICANTLY. A RANGE OF 10-30     ug/mL MAY BE AN EFFECTIVE     CONCENTRATION FOR MANY PATIENTS.     HOWEVER, SOME ARE BEST TREATED     AT CONCENTRATIONS OUTSIDE THIS     RANGE.     ACETAMINOPHEN CONCENTRATIONS     >150 ug/mL AT 4 HOURS AFTER     INGESTION AND >50 ug/mL AT 12     HOURS AFTER INGESTION ARE     OFTEN ASSOCIATED WITH TOXIC     REACTIONS.  CBC     Status: None   Collection Time    05/13/14 10:25 PM      Result Value Ref Range   WBC 5.0  4.0 - 10.5 K/uL   RBC 4.40  3.87 - 5.11 MIL/uL   Hemoglobin 14.6  12.0 - 15.0 g/dL   HCT 41.8  36.0 - 46.0 %   MCV 95.0  78.0 - 100.0 fL   MCH 33.2  26.0 - 34.0 pg   MCHC 34.9  30.0 - 36.0 g/dL   RDW 12.8  11.5 - 15.5 %   Platelets 259  150 - 400 K/uL  COMPREHENSIVE METABOLIC PANEL     Status: Abnormal   Collection Time    05/13/14 10:25 PM      Result Value Ref Range   Sodium 141  137 - 147 mEq/L   Potassium 3.9  3.7 - 5.3 mEq/L   Chloride 106  96 - 112 mEq/L   CO2 24  19 - 32 mEq/L   Glucose, Bld 98  70 - 99 mg/dL   BUN 12  6 - 23 mg/dL   Creatinine, Ser 0.49 (*) 0.50 - 1.10 mg/dL   Calcium 9.5  8.4 - 10.5 mg/dL   Total Protein 7.4  6.0 - 8.3 g/dL   Albumin 4.0  3.5 - 5.2 g/dL   AST 20  0 - 37 U/L   ALT 10  0 - 35 U/L   Alkaline Phosphatase 62  39 - 117 U/L   Total Bilirubin 0.3  0.3 - 1.2 mg/dL   GFR calc non Af Amer >90  >90 mL/min   GFR calc Af Amer >90  >90 mL/min   Comment: (NOTE)     The eGFR has been calculated using the CKD EPI equation.     This calculation has not been validated in all clinical situations.     eGFR's persistently <90 mL/min signify possible Chronic Kidney     Disease.   Anion gap 11  5 - 15  ETHANOL     Status: None   Collection Time     05/13/14 10:25 PM      Result Value Ref Range   Alcohol, Ethyl (B) <11  0 - 11 mg/dL   Comment:            LOWEST DETECTABLE LIMIT FOR     SERUM ALCOHOL IS 11 mg/dL     FOR MEDICAL PURPOSES ONLY  SALICYLATE LEVEL     Status: Abnormal   Collection Time    05/13/14 10:25 PM      Result Value Ref Range   Salicylate Lvl <6.2 (*) 2.8 - 20.0 mg/dL    Physical Findings: AIMS: Facial and  Oral Movements Muscles of Facial Expression: None, normal Lips and Perioral Area: None, normal Jaw: None, normal Tongue: None, normal,Extremity Movements Upper (arms, wrists, hands, fingers): None, normal Lower (legs, knees, ankles, toes): None, normal, Trunk Movements Neck, shoulders, hips: None, normal, Overall Severity Severity of abnormal movements (highest score from questions above): None, normal Incapacitation due to abnormal movements: None, normal Patient's awareness of abnormal movements (rate only patient's report): No Awareness, Dental Status Current problems with teeth and/or dentures?: Yes (miccing/ cavaties) Does patient usually wear dentures?: No  CIWA:  CIWA-Ar Total: 0 COWS:  COWS Total Score: 2  Assessment: Patient remains depressed, tearful, but not actively suicidal or psychotic. She reports significant weight gain over recent weeks, and presents undernourished.  She reports night sweats, but attributes these to menopausal symptoms. Denies coughing , fever, chills, or hemoptysis. A Chest X Ray from 2013 is unremarkable.  She is tolerating Remeron trial well.  Treatment Plan Summary: Daily contact with patient to assess and evaluate symptoms and progress in treatment Medication management See below   Plan: Continue inpatient treatment/support. Increase Remeron to 30 mgrs QHS Start Ibuprofen PRNS for wrist pain/ tendon pain. Will obtain hospitalist consult, and order CX Ray, TSH.  Medical Decision Making Problem Points:  Established problem, stable/improving (1), Review of  last therapy session (1) and Review of psycho-social stressors (1) Data Points:  Review or order clinical lab tests (1) Review of medication regiment & side effects (2) Review of new medications or change in dosage (2)  I certify that inpatient services furnished can reasonably be expected to improve the patient's condition.   COBOS, [place] 05/15/2014, 12:13 PM

## 2014-05-15 NOTE — BHH Group Notes (Signed)
[facility] LCSW Aftercare Discharge Planning Group Note  05/15/2014  8:45 AM  Participation Quality: Did Not Attend for unknown reason.  Tilden Fossa, MSW, [place] Worker [facility] [phone]

## 2014-05-15 NOTE — Consult Note (Signed)
[facility] Medical Consultation  Jamie Salinas QJJ:[id number] DOB: Jan 29, 1966 DOA: 05/14/2014 PCP: No PCP Per Patient   Requesting physician: Dr Parke Poisson Date of consultation: 05/15/2014 Reason for consultation: Weight Loss   Impression/Recommendations Principal Problem:   MDD (major depressive disorder), severe Active Problems:   Loss of weight   Depression    1. Unintentional weight loss. The differential remains broad as there are multiple possibilities. It's earliest possible in her case that this could be secondary to multiple factors including major depression/anxiety, tobacco abuse and COPD. I will recommend obtaining CMP, TSH, CBC, urinalysis, LDH, HIV, sedimentation rate, CRP, Hemoccult, hepatitis panel and chest x-ray. Some of these studies have already been ordered. Nutrition consult and protein boost 3 times a day. Ultimately she would need close outpatient follow-up and be set up for primary care. Would recommend social services consult to assist with PCP follow up.     I will followup again tomorrow. Please contact me if I can be of assistance in the meanwhile. Thank you for this consultation.  Chief Complaint: Weight Loss  HPI: Patient is a 48 year old female with a past medical history of major depression, anxiety, chronic obstructive pulmonary disease, tobacco abuse, reporting smoking 2-3 packs of cigarettes a day, who was admitted to the inpatient psychiatric service on 05/14/2014 for depression and suicidal ideations. Medicine consulted for evaluation of weight loss. Patient reports having multiple losses over the past year, most recently she and her husband separated in August, having financial difficulties since then. She reports inability to pay mortgage and chills which have contributed significantly to her stress. With regard to weight loss she reports losing weight and over the past year, attributing this to "spending my day smoking and just not eating." She  denies cough, shortness of breath, hemoptysis, hematemesis, bloody stools, melena, dysuria, hematuria, chest pain, fevers, chills, nausea, vomiting. She states her only recreational drug use is marijuana. Reports on occasion drinking socially, denies daily alcohol abuse. She has not had recent travel. During my encounter she became tearful on several occasions.                                           Review of Systems:  Constitutional:  Positive for weight loss, denies night sweats, Fevers, chills, fatigue.  HEENT:  No headaches, Difficulty swallowing,Tooth/dental problems,Sore throat,  No sneezing, itching, ear ache, nasal congestion, post nasal drip,  Cardio-vascular:  No chest pain, Orthopnea, PND, swelling in lower extremities, anasarca, dizziness, palpitations  GI:  No heartburn, indigestion, abdominal pain, nausea, vomiting, diarrhea, change in bowel habits, loss of appetite  Resp:  No shortness of breath with exertion or at rest. No excess mucus, no productive cough, No non-productive cough, No coughing up of blood.No change in color of mucus.No wheezing.No chest wall deformity  Skin:  no rash or lesions.  GU:  no dysuria, change in color of urine, no urgency or frequency. No flank pain.  Musculoskeletal:  No joint pain or swelling. No decreased range of motion. No back pain.  Psych:  Positive for depression and anxiety. No memory loss.    Past Medical History  Diagnosis Date  . Hypertension   . Asthma   . Depression   . Anxiety    Past Surgical History  Procedure Laterality Date  . Cesarean section     Social History:  reports that she has been smoking  Cigarettes.  She has been smoking about 2.00 packs per day. She does not have any smokeless tobacco history on file. She reports that she uses illicit drugs (Marijuana). She reports that she does not drink alcohol.  Allergies  Allergen Reactions  . Amoxicillin Shortness Of Breath, Nausea And Vomiting and Rash  .  Codeine Nausea And Vomiting  . Adhesive [Tape] Rash   Family History  Problem Relation Age of Onset  . Cancer Mother     Prior to Admission medications   Medication Sig Start Date End Date Taking? Authorizing Provider  acetaminophen (TYLENOL) 500 MG tablet Take 1,000-1,500 mg by mouth every 6 (six) hours as needed for mild pain.   Yes Historical Provider, MD  naproxen (NAPROSYN) 500 MG tablet Take 1 tablet (500 mg total) by mouth 2 (two) times daily with a meal. 04/15/14  Yes Johnna Acosta, MD   Physical Exam: Blood pressure 89/60, pulse 90, temperature 97.9 F (36.6 C), temperature source Oral, resp. rate 20, height 4\' 10"  (1.473 m), weight 32.659 kg (72 lb), last menstrual period 03/09/2012. Filed Vitals:   05/15/14 0636  BP: 89/60  Pulse: 90  Temp:   Resp:      General:  Thin, disheveled, cachectic, appearing older than stated age  Eyes: Pupils equal round reactive to light extraocular movement and  Neck: Supple symmetrical no jugular venous distention  Cardiovascular: Regular rate and rhythm normal S1-S2  Respiratory: Normal respiratory effort, no wheezing rhonchi or rales  Abdomen: Soft nontender nondistended  Musculoskeletal: Bilateral muscle atrophy  Psychiatric: Patient tearful, depressed, however awake alert conversive  Neurologic: Nonfocal  Labs on Admission:  Basic Metabolic Panel:  Recent Labs Lab 05/13/14 2225  NA 141  K 3.9  CL 106  CO2 24  GLUCOSE 98  BUN 12  CREATININE 0.49*  CALCIUM 9.5   Liver Function Tests:  Recent Labs Lab 05/13/14 2225  AST 20  ALT 10  ALKPHOS 62  BILITOT 0.3  PROT 7.4  ALBUMIN 4.0   No results found for this basename: LIPASE, AMYLASE,  in the last 168 hours No results found for this basename: AMMONIA,  in the last 168 hours CBC:  Recent Labs Lab 05/13/14 2225  WBC 5.0  HGB 14.6  HCT 41.8  MCV 95.0  PLT 259   Cardiac Enzymes:  Recent Labs Lab 05/13/14 2218  TROPONINI <0.30   BNP: No  components found with this basename: POCBNP,  CBG: No results found for this basename: GLUCAP,  in the last 168 hours  Radiological Exams on Admission: No results found.  EKG: Independently reviewed.   Time spent: 50 min  Kelvin Cellar [facility] Pager [phone]  If 7PM-7AM, please contact night-coverage [email or web address] Password [email or web address] 05/15/2014, 2:16 PM

## 2014-05-15 NOTE — BHH Group Notes (Signed)
[place] LCSW Group Therapy 05/15/2014  1:15 pm  Type of Therapy: Group Therapy Participation Level: Minimal  Participation Quality: Minimal  Affect: Depressed and Flat  Cognitive: Alert and Oriented  Insight: Developing/Improving and Engaged  Engagement in Therapy: Developing/Improving and Engaged  Modes of Intervention: Clarification, Confrontation, Discussion, Education, Exploration,  Limit-setting, Orientation, Problem-solving, Rapport Building, Art therapist, Socialization and Support  Summary of Progress/Problems: Pt identified obstacles faced currently and processed barriers involved in overcoming these obstacles. Pt identified steps necessary for overcoming these obstacles and explored motivation (internal and external) for facing these difficulties head on. Pt further identified one area of concern in their lives and chose a goal to focus on for today. Patient came to group before leaving to speak with RN and did not return. Patient did not participate in group discussion.  Tilden Fossa, MSW, [place] Worker [facility] [phone]

## 2014-05-15 NOTE — Progress Notes (Signed)
Patient ID: Jamie Salinas, female   DOB: August 03, 1966, 48 y.o.   MRN: [id number] She has been up and about interacting with peers and staff. She has several lab orders. urine has been been obtained. She has been instructed about needing additional samples. She went for her CX-R this afternoon. She requested  And received a prn for c/o pain rt wrist that was effective. Self inventory this AM: depression 4, hopelessness 4, anxiety 6,  W/D irritation agitation and craving. She has SI thoughts almost all the time. She contracts for safety. Goal is to work on her depression and herself.

## 2014-05-16 ENCOUNTER — Encounter: Payer: Self-pay | Admitting: Physician Assistant

## 2014-05-16 LAB — HEPATITIS PANEL, ACUTE
HCV Ab: NEGATIVE
HEP A IGM: NONREACTIVE
Hep B C IgM: NONREACTIVE
Hepatitis B Surface Ag: NEGATIVE

## 2014-05-16 LAB — HIV ANTIBODY (ROUTINE TESTING W REFLEX): HIV 1&2 Ab, 4th Generation: NONREACTIVE

## 2014-05-16 LAB — TSH: TSH: 1.05 u[IU]/mL (ref 0.350–4.500)

## 2014-05-16 LAB — OCCULT BLOOD X 1 CARD TO LAB, STOOL: FECAL OCCULT BLD: POSITIVE — AB

## 2014-05-16 LAB — C-REACTIVE PROTEIN: CRP: <0.5 mg/dL — ABNORMAL LOW (ref <0.60)

## 2014-05-16 MED ORDER — ACETAMINOPHEN 325 MG PO TABS
325.0000 mg | ORAL_TABLET | ORAL | Status: DC | PRN
  Administered 2014-05-16 – 2014-05-17 (×3): 325 mg via ORAL
  Filled 2014-05-16 (×4): qty 1

## 2014-05-16 MED ORDER — PANTOPRAZOLE SODIUM 40 MG PO TBEC
40.0000 mg | DELAYED_RELEASE_TABLET | Freq: Every day | ORAL | Status: DC
  Administered 2014-05-16 – 2014-05-18 (×3): 40 mg via ORAL
  Filled 2014-05-16 (×4): qty 1
  Filled 2014-05-16: qty 14
  Filled 2014-05-16: qty 1

## 2014-05-16 NOTE — Plan of Care (Signed)
Problem: Alteration in mood; excessive anxiety as evidenced by: Goal: LTG-Patient's behavior demonstrates decreased anxiety (Patient's behavior demonstrates anxiety and he/she is utilizing learned coping skills to deal with anxiety-producing situations)  Outcome: Progressing Pt observed in dayroom interacting, laughing, joking and talking to other patients.   Goal: STG-Pt will report an absence of self-harm thoughts/actions (Patient will report an absence of self-harm thoughts or actions)  Outcome: Progressing Pt denies SI/HI

## 2014-05-16 NOTE — Progress Notes (Signed)
D: Pt denies SI/HI/AVH. Pt is pleasant and cooperative. Pt stated she felt better today. Pt got order to wear wrist brace while awake.   A: Pt was offered support and encouragement. Pt was given scheduled medications. Pt was encourage to attend groups. Q 15 minute checks were done for safety.   R:Pt attends groups and interacts well with peers and staff. Pt is taking medication.Pt receptive to treatment and safety maintained on unit.

## 2014-05-16 NOTE — BHH Group Notes (Signed)
[place] LCSW Group Therapy  05/16/2014   1:15 PM   Type of Therapy:  Group Therapy  Participation Level:  Active  Participation Quality:  Attentive, Sharing and Supportive  Affect:  Depressed and Flat  Cognitive:  Alert and Oriented  Insight:  Developing/Improving and Engaged  Engagement in Therapy:  Developing/Improving and Engaged  Modes of Intervention:  Clarification, Confrontation, Discussion, Education, Exploration, Limit-setting, Orientation, Problem-solving, Rapport Building, Art therapist, Socialization and Support  Summary of Progress/Problems: The topic for group therapy was feelings about diagnosis.  Pt actively participated in group discussion on their past and current diagnosis and how they feel towards this.  Pt also identified how society and family members judge them, based on their diagnosis as well as stereotypes and stigmas.  Patient reports that she experiences depression and anxiety due to recent losses in her life. Patient shared that she would like to focus on being happy and incorporating more positive aspects into her life like spending time with her daughter. CSW offered emotional support and encouragement.  Tilden Fossa, MSW, [place] Worker [facility] [phone]
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
## 2014-05-16 NOTE — Progress Notes (Signed)
Pt has been up and active in the milieu today.  She has been going to groups and interacting with her peers and staff appropriately.  She rated her depression 6 hopelessness a 3.5 and anxiety 6 on her self-inventory.  She check that she was withdrawing from drugs/alcohol symptoms she checked cravings, agitation, irritable, and chilling.  She was positive for [facility] and she did admit she was an "recovering alcoholic" last drink was about 1-2 weeks ago.  She denied S/H ideation or A/V/H.  She needs to provide another stool sample and sputum sample pt is aware.  She had a medical consult today the doctor told her that she was positive for blood in her stool and he felt that she was having gastritis from alcohol or motrin and he plans to change her to tylenol and add protonix along with hemorrhoids.  He told her that she would need to f/u for a endoscopy and colonoscopy after her discharge. Pt did voice understanding this nurse was present while Dr. Keturah Barre Elgergawy talked with her. No new orders written thus far. Pt has had prn vistaril at 0816 and 1708 which was effective and prn ibuprofen at 0822 which was helpful (this was before the Dr. Noelle Penner visited around [id number]).  She has her brace as ordered. She plans to f/u at [facility] in [place].

## 2014-05-16 NOTE — Progress Notes (Addendum)
[facility] MD Progress Note  05/16/2014 3:36 PM Jamie Salinas  MRN:  [id number] Subjective:   Patient reports improved mood, less severe depression Objective:  I have discussed case with treatment team and have met with patient. Patient presents with a fuller range of affect and overall improved mood . She does remain depressed, but compared to admission she is improved. She has been going to groups , and is interactive in milieu. Denies any medication side effects at this time. Appreciate hospitalist consult  Regarding significant  unintentional weight loss. Labs reviewed - unremarkable except for +  Fecal occult blood . CX RAY suggestive of COPD but no acute findings. We discussed benefits of smoking cessation and reviewed recommendations. She states she wants to stop smoking and finds the nicotine patches helpful, well tolerated.   Diagnosis: MDD     Total Time spent with patient: 25 minutes     ADL's: improved   Sleep: fair   Appetite: improving  Suicidal Ideation:  Denies any current suicidal ideaitons Homicidal Ideation:  Denies  AEB (as evidenced by):  Psychiatric Specialty Exam: Physical Exam  Review of Systems  Constitutional: Positive for weight loss. Negative for fever and chills.  Eyes: Negative.   Respiratory: Negative for cough, hemoptysis and shortness of breath.   Cardiovascular: Negative for chest pain.  Gastrointestinal: Negative for nausea, vomiting and abdominal pain.  Skin: Negative for rash.  Psychiatric/Behavioral: Positive for depression. Negative for suicidal ideas.    Blood pressure 131/66, pulse 75, temperature 97.8 F (36.6 C), temperature source Oral, resp. rate 16, height 4\' 10"  (1.473 m), weight 32.659 kg (72 lb), last menstrual period 03/09/2012.Body mass index is 15.05 kg/(m^2).  General Appearance: improved grooming   Eye Contact::  Good  Speech:  Normal Rate  Volume:  Normal  Mood:  less depressed   Affect:  still constricted but more  reactive   Thought Process:  Goal Directed and Linear  Orientation:  Other:  fully alert and attentive   Thought Content:  denies hallucinations, no delusions expressed   Suicidal Thoughts:  No  Homicidal Thoughts:  No  Memory:  recent and remote grossly intact   Judgement:  Fair  Insight:  Fair  Psychomotor Activity:  Normal  Concentration:  Good  Recall:  Good  Fund of Knowledge:Good  Language: Good  Akathisia:  Negative  Handed:  Right  AIMS (if indicated):     Assets:  Desire for Improvement Resilience  Sleep:  Number of Hours: 5.25   Musculoskeletal: Strength & Muscle Tone: within normal limits Gait & Station: normal Patient leans: N/A  Current Medications: Current Facility-Administered Medications  Medication Dose Route Frequency Provider Last Rate Last Dose  . alum & mag hydroxide-simeth (MAALOX/MYLANTA) 200-200-20 MG/5ML suspension 30 mL  30 mL Oral Q4H PRN 02-17-2001, MD      . feeding supplement (ENSURE COMPLETE) (ENSURE COMPLETE) liquid 237 mL  237 mL Oral TID WC Mojeed Akintayo   237 mL at 05/16/14 1300  . hydrocortisone cream 1 %   Topical BID 07/16/14, MD      . hydrOXYzine (ATARAX/VISTARIL) tablet 25 mg  25 mg Oral Q6H PRN Jeralyn Bennett, MD   25 mg at 05/16/14 0816  . ibuprofen (ADVIL,MOTRIN) tablet 400 mg  400 mg Oral Q6H PRN 07/16/14, MD   400 mg at 05/16/14 07/16/14  . lisinopril (PRINIVIL,ZESTRIL) tablet 5 mg  5 mg Oral Daily 9217, NP   5 mg at 05/16/14 0816  .  magnesium hydroxide (MILK OF MAGNESIA) suspension 30 mL  30 mL Oral Daily PRN Neita Garnet, MD      . mirtazapine (REMERON) tablet 30 mg  30 mg Oral QHS Neita Garnet, MD   30 mg at 05/15/14 2240  . neomycin-bacitracin-polymyxin (NEOSPORIN) ointment   Topical PRN Delfin Gant, NP   1 application at 99/83/38 1820  . nicotine (NICODERM CQ - dosed in mg/24 hours) patch 21 mg  21 mg Transdermal Q0600 Neita Garnet, MD   21 mg at 05/16/14 0820  . traZODone (DESYREL) tablet  50 mg  50 mg Oral QHS Delfin Gant, NP   50 mg at 05/15/14 2240    Lab Results:  Results for orders placed during the hospital encounter of 05/14/14 (from the past 48 hour(s))  OCCULT BLOOD X 1 CARD TO LAB, STOOL     Status: Abnormal   Collection Time    05/15/14  6:57 PM      Result Value Ref Range   Fecal Occult Bld POSITIVE (*) NEGATIVE   Comment: Performed at [facility]  HIV ANTIBODY (ROUTINE TESTING)     Status: None   Collection Time    05/15/14  7:33 PM      Result Value Ref Range   HIV 1&2 Ab, 4th Generation NONREACTIVE  NONREACTIVE   Comment: (NOTE)     A NONREACTIVE HIV Ag/Ab result does not exclude HIV infection since     the time frame for seroconversion is variable. If acute HIV infection     is suspected, a HIV-1 RNA Qualitative TMA test is recommended.     HIV-1/2 Antibody Diff         Not indicated.     HIV-1 RNA, Qual TMA           Not indicated.     PLEASE NOTE: This information has been disclosed to you from records     whose confidentiality may be protected by state law. If your state     requires such protection, then the state law prohibits you from making     any further disclosure of the information without the specific written     consent of the person to whom it pertains, or as otherwise permitted     by law. A general authorization for the release of medical or other     information is NOT sufficient for this purpose.     The performance of this assay has not been clinically validated in     patients less than 13 years old.     Performed at [facility]     Status: None   Collection Time    05/15/14  7:33 PM      Result Value Ref Range   Sed Rate 21  0 - 22 mm/hr   Comment: Performed at [facility]     Status: Abnormal   Collection Time    05/15/14  7:33 PM      Result Value Ref Range   CRP <0.5 (*) <0.60 mg/dL   Comment: Performed at [facility] DEHYDROGENASE     Status: None   Collection Time    05/15/14  7:33 PM      Result Value Ref Range   LDH 188  94 - 250 U/L   Comment: Performed at [facility], ACUTE     Status: None  Collection Time    05/15/14  7:33 PM      Result Value Ref Range   Hepatitis B Surface Ag NEGATIVE  NEGATIVE   HCV Ab NEGATIVE  NEGATIVE   Hep A IgM NON REACTIVE  NON REACTIVE   Comment: (NOTE)     Effective June 26, 2014, Hepatitis Acute Panel (test code [phone])     will be revised to automatically reflex to the Hepatitis C Viral RNA,     Quantitative, Real-Time PCR assay if the Hepatitis C antibody     screening result is Reactive. This action is being taken to ensure     that the CDC/USPSTF recommended HCV diagnostic algorithm with the     appropriate test reflex needed for accurate interpretation is     followed.   Hep B C IgM NON REACTIVE  NON REACTIVE   Comment: (NOTE)     High levels of Hepatitis B Core IgM antibody are detectable     during the acute stage of Hepatitis B. This antibody is used     to differentiate current from past HBV infection.     Performed at [facility]  TSH     Status: None   Collection Time    05/15/14  7:33 PM      Result Value Ref Range   TSH 1.050  0.350 - 4.500 uIU/mL   Comment: Performed at [facility]    Physical Findings: AIMS: Facial and Oral Movements Muscles of Facial Expression: None, normal Lips and Perioral Area: None, normal Jaw: None, normal Tongue: None, normal,Extremity Movements Upper (arms, wrists, hands, fingers): None, normal Lower (legs, knees, ankles, toes): None, normal, Trunk Movements Neck, shoulders, hips: None, normal, Overall Severity Severity of abnormal movements (highest score from questions above): None, normal Incapacitation due to abnormal movements: None, normal Patient's awareness of abnormal movements (rate only patient's report): No Awareness, Dental  Status Current problems with teeth and/or dentures?: Yes (miccing/ cavaties) Does patient usually wear dentures?: No  CIWA:  CIWA-Ar Total: 0 COWS:  COWS Total Score: 2  Treatment Plan Summary: Daily contact with patient to assess and evaluate symptoms and progress in treatment Medication management See below   Plan: Continue inpatient treatment/  Provide support/milieu Continue  Remeron 30 mgrs QHS Continue Ensure supplements Encourage patient to follow up with outside provider/PCP regarding ongoing work up / referral for  colonoscopy if needed.  Medical Decision Making Problem Points:  Established problem, stable/improving (1), Review of last therapy session (1) and Review of psycho-social stressors (1) Data Points:  Review or order clinical lab tests (1) Review of medication regiment & side effects (2)  I certify that inpatient services furnished can reasonably be expected to improve the patient's condition.   COBOS, [place] 05/16/2014, 3:36 PM

## 2014-05-16 NOTE — Progress Notes (Signed)
Patient Demographics  Jamie Salinas, is a 48 y.o. female, DOB - 1966/07/28, EHO:[id number]  Admit date - 05/14/2014   Admitting Physician Neita Garnet, MD  Outpatient Primary MD for the patient is No PCP Per Patient  LOS - 2   No chief complaint on file.      Brief narrative: Medicine were consulted to evaluate for an intentional weight loss, patient reports 20 pound weight loss over the last few month, reports it unintentional, but reports she has been stressed out a lot, having decreased oral intake as well. Subjective:   Jamie Salinas today has, No headache, No chest pain, No abdominal pain - No Nausea, No new weakness tingling or numbness, No Cough - SOB.  Assessment & Plan    Principal Problem:   MDD (major depressive disorder), severe Active Problems:   Depression   Loss of weight  Unintentional weight loss: Patient had extensive workup which did show normal values including, normal blood work including normal CBC and BMP, LFTs, low inflammatory markers including CRP, and sedimentation rate, negative hepatitis panel, nonreactive HIV, chest x-ray  finding of COPD disease, with no other acute finding, TSH within normal limit, only significant finding was Hemoccult positive stool, but patient reports having active hemorrhoids currently, she denies any family history of colon cancer, but she is at age where she will need for screening colonoscopy, scheduled an outpatient followup appointment with gastroenterology clinic to have colonoscopy/endoscopy done, the patient was given the information, and instructed about the importance of the followup,(10/20 at 9:30 am). As well patient has been taking Motrin for carpal tunnel syndrome, so it might be causing gastritis as well causing her to have positive Hemoccult stool, so will DC Motrin, will start on Tylenol as  needed for pain, will start on PPI as well.  Will sign off , please contact us if you have any further question arises.  Family Communication: patient is alert and oriented.       Scheduled Meds: . feeding supplement (ENSURE COMPLETE)  237 mL Oral TID WC  . hydrocortisone cream   Topical BID  . lisinopril  5 mg Oral Daily  . mirtazapine  30 mg Oral QHS  . nicotine  21 mg Transdermal Q0600  . traZODone  50 mg Oral QHS   Continuous Infusions:  PRN Meds:.alum & mag hydroxide-simeth, hydrOXYzine, ibuprofen, magnesium hydroxide, neomycin-bacitracin-polymyxin    Lab Results  Component Value Date   PLT 259 05/13/2014      Anti-infectives   None          Objective:   Filed Vitals:   05/15/14 1314 05/15/14 1700 05/16/14 0700 05/16/14 0701  BP:  107/77 137/73 131/66  Pulse:  77 73 75  Temp:   97.8 F (36.6 C)   TempSrc:      Resp:  18 16   Height:      Weight: 32.659 kg (72 lb)       Wt Readings from Last 3 Encounters:  05/15/14 32.659 kg (72 lb)  05/13/14 34.02 kg (75 lb)  04/15/14 33.169 kg (73 lb 2 oz)    No intake or output data in the 24 hours ending 05/16/14 1733   Physical Exam  Awake Alert, Oriented X 3, thin-appearing .  No new F.N deficits, Normal affect [place].AT,PERRAL Supple Neck,No JVD, No cervical lymphadenopathy appriciated.  Symmetrical Chest wall movement, Good air movement bilaterally, CTAB RRR,No Gallops,Rubs or new Murmurs, No Parasternal Heave +ve B.Sounds, Abd Soft, No tenderness, No organomegaly appriciated, No rebound - guarding or rigidity. No Cyanosis, Clubbing or edema, No new Rash or bruise     Data Review   Micro Results No results found for this or any previous visit (from the past 240 hour(s)).  Radiology Reports Dg Chest 2 View  05/15/2014   CLINICAL DATA:  Cough.  EXAM: CHEST  2 VIEW  COMPARISON:  April 11, 2012.  FINDINGS: The heart size and mediastinal contours are within normal limits. Both lungs are clear. No  pneumothorax or pleural effusion is noted. Hyperexpansion of the lungs is noted consistent with chronic obstructive pulmonary disease. The visualized skeletal structures are unremarkable.  IMPRESSION: Findings consistent with chronic obstructive pulmonary disease. No acute cardiopulmonary abnormality seen.   Electronically Signed   By: Sabino Dick M.D.   On: 05/15/2014 15:29    CBC  Recent Labs Lab 05/13/14 2225  WBC 5.0  HGB 14.6  HCT 41.8  PLT 259  MCV 95.0  MCH 33.2  MCHC 34.9  RDW 12.8    Chemistries   Recent Labs Lab 05/13/14 2225  NA 141  K 3.9  CL 106  CO2 24  GLUCOSE 98  BUN 12  CREATININE 0.49*  CALCIUM 9.5  AST 20  ALT 10  ALKPHOS 62  BILITOT 0.3   ------------------------------------------------------------------------------------------------------------------ estimated creatinine clearance is 43.4 ml/min (by C-G formula based on Cr of 0.49). ------------------------------------------------------------------------------------------------------------------ No results found for this basename: HGBA1C,  in the last 72 hours ------------------------------------------------------------------------------------------------------------------ No results found for this basename: CHOL, HDL, LDLCALC, TRIG, CHOLHDL, LDLDIRECT,  in the last 72 hours ------------------------------------------------------------------------------------------------------------------  Recent Labs  05/15/14 1933  TSH 1.050   ------------------------------------------------------------------------------------------------------------------ No results found for this basename: VITAMINB12, FOLATE, FERRITIN, TIBC, IRON, RETICCTPCT,  in the last 72 hours  Coagulation profile No results found for this basename: INR, PROTIME,  in the last 168 hours  No results found for this basename: DDIMER,  in the last 72 hours  Cardiac Enzymes  Recent Labs Lab 05/13/14 2218  TROPONINI <0.30    ------------------------------------------------------------------------------------------------------------------ No components found with this basename: POCBNP,      Time Spent in minutes   25 minutes.   [facility], Eddith Mentor M.D on 05/16/2014 at 5:33 PM  Between 7am to 7pm - Pager - [phone]  After 7pm go to [email or web address] - password [email or web address]  And look for the night coverage person covering for me after hours  [facility] Office  [phone]   **Disclaimer: This note may have been dictated with voice recognition software. Similar sounding words can inadvertently be transcribed and this note may contain transcription errors which may not have been corrected upon publication of note.**

## 2014-05-16 NOTE — Tx Team (Signed)
Interdisciplinary Treatment Plan Update (Adult) Date: 05/16/2014   Time Reviewed: 9:30 AM  Progress in Treatment: Attending groups: Yes Participating in groups: Yes Taking medication as prescribed: Yes Tolerating medication: Yes Family/Significant other contact made: No, CSW continuing to assess for appropriate collateral contact Patient understands diagnosis: Yes Discussing patient identified problems/goals with staff: Yes Medical problems stabilized or resolved: Yes Denies suicidal/homicidal ideation: CSW continuing to assess  New problem(s) identified: N/A  Discharge Plan or Barriers: CSW continuing to assess.  Reason for Continuation of Hospitalization:  Depression Anxiety Medication Stabilization   Comments: N/A  Estimated length of stay: 3-5 days  For review of initial/current patient goals, please see plan of care.   Attendees:  Patient:    Family:    Physician: Dr. Parke Poisson; Dr. Sabra Heck  05/16/2014 9:30 AM   Nursing: Marilynne Halsted; Satira Sark, RN  05/16/2014 9:30 AM   Clinical Social Worker: Tilden Fossa, LCSWA  05/16/2014 9:30 AM   Other: Joette Catching, LCSW  05/16/2014 9:30 AM   Other: Lucinda Dell, [place] Sessions Liaison  05/16/2014 9:30 AM   Other: Lars Pinks, Case Manager  05/16/2014 9:30 AM   Other:  05/16/2014 9:30 AM       Scribe for Treatment Team:  Tilden Fossa, MSW, [employer] [phone]

## 2014-05-16 NOTE — Progress Notes (Signed)
The focus of this group is to educate the patient on the purpose and policies of crisis stabilization and provide a format to answer questions about their admission.  The group details unit policies and expectations of patients while admitted. Patient attended this group.

## 2014-05-17 NOTE — Progress Notes (Signed)
Patient ID: Jamie Salinas, female   DOB: Nov 08, 1965, 48 y.o.   MRN: [id number] [facility] MD Progress Note  05/17/2014 2:12 PM JEHAN RANGANATHAN  MRN:  [id number] Subjective:    States she is feeling better, but still " upset " about her separation from husband.  Objective:   At this time mood and affect are improved, although she still reports feeling sad about recent separation from husband and becomes tearful when discussing this issue. Today she is improved compared to admission.  On unit, she is calm, pleasant and participative in groups and milieu. She is tolerating Remeron well.  Her appetite is improved, and she has gained 3-4 lbs since admission. We discussed issues pertaining to alcohol and cannabis- patient does have a history of alcohol abuse and had resumed drinking occasionally ( not regularly ) after separation from husband. We discussed importance of abstinence from alcohol, and also reviewed the negative impact that cannabis can have on mood and also potential negative role of smoking on COPD.   Diagnosis: MDD     Total Time spent with patient: 25 minutes     ADL's: improved   Sleep: improved    Appetite: improving  Suicidal Ideation:  Denies any current suicidal ideaitons Homicidal Ideation:  Denies  AEB (as evidenced by):  Psychiatric Specialty Exam: Physical Exam  Review of Systems  Constitutional: Positive for weight loss. Negative for fever and chills.  Eyes: Negative.   Respiratory: Negative for cough, hemoptysis and shortness of breath.   Cardiovascular: Negative for chest pain.  Gastrointestinal: Negative for nausea, vomiting and abdominal pain.  Skin: Negative for rash.  Psychiatric/Behavioral: Positive for depression. Negative for suicidal ideas.    Blood pressure 100/71, pulse 83, temperature 98.4 F (36.9 C), temperature source Oral, resp. rate 16, height 4\' 10"  (1.473 m), weight 33.566 kg (74 lb), last menstrual period 03/09/2012.Body mass index is  15.47 kg/(m^2).  General Appearance: improved grooming   Eye Contact::  Good  Speech:  Normal Rate  Volume:  Normal  Mood:  mood gradually improving, affect still constricted but reactive   Affect:  Constricted  Thought Process:  Goal Directed and Linear  Orientation:  Other:  fully alert and attentive   Thought Content:  denies hallucinations, no delusions expressed   Suicidal Thoughts:  No- denies any thoughts of hurting herself or anyone else .   Homicidal Thoughts:  No  Memory:  recent and remote grossly intact   Judgement:  Fair  Insight:  Fair  Psychomotor Activity:  Normal  Concentration:  Good  Recall:  Good  Fund of Knowledge:Good  Language: Good  Akathisia:  Negative  Handed:  Right  AIMS (if indicated):     Assets:  Desire for Improvement Resilience  Sleep:  Number of Hours: 5.25   Musculoskeletal: Strength & Muscle Tone: within normal limits Gait & Station: normal Patient leans: N/A  Current Medications: Current Facility-Administered Medications  Medication Dose Route Frequency Provider Last Rate Last Dose  . acetaminophen (TYLENOL) tablet 325 mg  325 mg Oral Q4H PRN Phillips Climes, MD   325 mg at 05/17/14 1110  . alum & mag hydroxide-simeth (MAALOX/MYLANTA) 200-200-20 MG/5ML suspension 30 mL  30 mL Oral Q4H PRN Neita Garnet, MD      . feeding supplement (ENSURE COMPLETE) (ENSURE COMPLETE) liquid 237 mL  237 mL Oral TID WC Mojeed Akintayo   237 mL at 05/17/14 1200  . hydrocortisone cream 1 %   Topical BID Kelvin Cellar, MD      .  hydrOXYzine (ATARAX/VISTARIL) tablet 25 mg  25 mg Oral Q6H PRN Neita Garnet, MD   25 mg at 05/17/14 0747  . lisinopril (PRINIVIL,ZESTRIL) tablet 5 mg  5 mg Oral Daily Encarnacion Slates, NP   5 mg at 05/17/14 0744  . magnesium hydroxide (MILK OF MAGNESIA) suspension 30 mL  30 mL Oral Daily PRN Neita Garnet, MD      . mirtazapine (REMERON) tablet 30 mg  30 mg Oral QHS Neita Garnet, MD   30 mg at 05/16/14 2228  .  neomycin-bacitracin-polymyxin (NEOSPORIN) ointment   Topical PRN Delfin Gant, NP   1 application at 35/32/99 1820  . nicotine (NICODERM CQ - dosed in mg/24 hours) patch 21 mg  21 mg Transdermal Q0600 Neita Garnet, MD   21 mg at 05/17/14 2426  . pantoprazole (PROTONIX) EC tablet 40 mg  40 mg Oral Daily Phillips Climes, MD   40 mg at 05/17/14 0744  . traZODone (DESYREL) tablet 50 mg  50 mg Oral QHS Delfin Gant, NP   50 mg at 05/16/14 2228    Lab Results:  Results for orders placed during the hospital encounter of 05/14/14 (from the past 48 hour(s))  OCCULT BLOOD X 1 CARD TO LAB, STOOL     Status: Abnormal   Collection Time    05/15/14  6:57 PM      Result Value Ref Range   Fecal Occult Bld POSITIVE (*) NEGATIVE   Comment: Performed at [facility]  HIV ANTIBODY (ROUTINE TESTING)     Status: None   Collection Time    05/15/14  7:33 PM      Result Value Ref Range   HIV 1&2 Ab, 4th Generation NONREACTIVE  NONREACTIVE   Comment: (NOTE)     A NONREACTIVE HIV Ag/Ab result does not exclude HIV infection since     the time frame for seroconversion is variable. If acute HIV infection     is suspected, a HIV-1 RNA Qualitative TMA test is recommended.     HIV-1/2 Antibody Diff         Not indicated.     HIV-1 RNA, Qual TMA           Not indicated.     PLEASE NOTE: This information has been disclosed to you from records     whose confidentiality may be protected by state law. If your state     requires such protection, then the state law prohibits you from making     any further disclosure of the information without the specific written     consent of the person to whom it pertains, or as otherwise permitted     by law. A general authorization for the release of medical or other     information is NOT sufficient for this purpose.     The performance of this assay has not been clinically validated in     patients less than 46 years old.     Performed at [facility]     Status: None   Collection Time    05/15/14  7:33 PM      Result Value Ref Range   Sed Rate 21  0 - 22 mm/hr   Comment: Performed at [facility]     Status: Abnormal   Collection Time    05/15/14  7:33 PM      Result Value Ref Range   CRP <  0.5 (*) <0.60 mg/dL   Comment: Performed at [facility] DEHYDRO[facility]NASE     Status: None   Collection Time    05/15/14  7:33 PM      Result Value Ref Range   LDH 188  94 - 250 U/L   Comment: Performed at [place], ACUTE     Status: None   Collection Time    05/15/14  7:33 PM      Result Value Ref Range   Hepatitis B Surface Ag NEGATIVE  NEGATIVE   HCV Ab NEGATIVE  NEGATIVE   Hep A IgM NON REACTIVE  NON REACTIVE   Comment: (NOTE)     Effective June 26, 2014, Hepatitis Acute Panel (test code 22940)     will be revised to automatically reflex to the Hepatitis C Viral RNA,     Quantitative, Real-Time PCR assay if the Hepatitis C antibody     screening result is Reactive. This action is being taken to ensure     that the CDC/USPSTF recommended HCV diagnostic algorithm with the     appropriate test reflex needed for accurate interpretation is     followed.   Hep B C IgM NON REACTIVE  NON REACTIVE   Comment: (NOTE)     High levels of Hepatitis B Core IgM antibody are detectable     during the acute stage of Hepatitis B. This antibody is used     to differentiate current from past HBV infection.     Performed at [facility]  TSH     Status: None   Collection Time    05/15/14  7:33 PM      Result Value Ref Range   TSH 1.050  0.350 - 4.500 uIU/mL   Comment: Performed at [facility]    Physical Findings: AIMS: Facial and Oral Movements Muscles of Facial Expression: None, normal Lips and Perioral Area: None, normal Jaw: None, normal Tongue: None, normal,Extremity Movements Upper (arms,  wrists, hands, fingers): None, normal Lower (legs, knees, ankles, toes): None, normal, Trunk Movements Neck, shoulders, hips: None, normal, Overall Severity Severity of abnormal movements (highest score from questions above): None, normal Incapacitation due to abnormal movements: None, normal Patient's awareness of abnormal movements (rate only patient's report): No Awareness, Dental Status Current problems with teeth and/or dentures?: Yes (miccing/ cavaties) Does patient usually wear dentures?: No  CIWA:  CIWA-Ar Total: 0 COWS:  COWS Total Score: 2  Assessment:  At this time patient is improved compared to admission, but remains ruminative about recent separation , and becomes briefly tearful when discussing this. Otherwise, her mood and affect are improved, brighter.  No suicidal ideations. Tolerating Remeron well.  Treatment Plan Summary: Daily contact with patient to assess and evaluate symptoms and progress in treatment Medication management See below   Plan: Continue inpatient treatment/  Provide support/milieu Consider discharge soon as she continues to improve Continue  Remeron 30 mgrs QHS Continue Ensure supplements Patient states she plans to follow up as outpatient , as recommended by hospitalist consult , on 10/20 at gastroenterology clinic for colonscopy/endoscopy, due to recently positive guaiac.   Medical Decision Making Problem Points:  Established problem, stable/improving (1), Review of last therapy session (1) and Review of psycho-social stressors (1) Data Points:  Review of medication regiment & side effects (2)  I certify that inpatient services furnished can reasonably be expected to improve the patient's condition.   COBOS, FERNANDO 05/17/2014, 2:12  PM

## 2014-05-17 NOTE — BHH Group Notes (Signed)
[place] LCSW Group Therapy 05/17/2014  1:15 PM Type of Therapy: Group Therapy Participation Level: Active  Participation Quality: Attentive, Sharing and Supportive  Affect: Depressed and Flat  Cognitive: Alert and Oriented  Insight: Developing/Improving and Engaged  Engagement in Therapy: Developing/Improving and Engaged  Modes of Intervention: Clarification, Confrontation, Discussion, Education, Exploration, Limit-setting, Orientation, Problem-solving, Rapport Building, Art therapist, Socialization and Support  Summary of Progress/Problems: The topic for group today was emotional regulation. This group focused on both positive and negative emotion identification and allowed group members to process ways to identify feelings, regulate negative emotions, and find healthy ways to manage internal/external emotions. Group members were asked to reflect on a time when their reaction to an emotion led to a negative outcome and explored how alternative responses using emotion regulation would have benefited them. Group members were also asked to discuss a time when emotion regulation was utilized when a negative emotion was experienced. Patient attended group and actively listened. She left toward the end of group and did not return. Patient left before sharing on topic.  Tilden Fossa, MSW, [place] Worker [facility] [phone]

## 2014-05-17 NOTE — Progress Notes (Signed)
D    Pt is pleasant on approach   She reports anxiety and depression  She drinks Ensure and said she thinks it is helping her gain weight but it causes her to feel bloated    A   Verbal support given    Medications administered and educated on same    Q 15 min safety checks R   Pt safe at present

## 2014-05-17 NOTE — Progress Notes (Signed)
D: Patient denies SI/HI and A/V hallucinations; patient complains of anxiety  A: Monitored q 15 minutes; patient encouraged to attend groups; patient educated about medications; patient given medications per physician orders; patient encouraged to express feelings and/or concerns  R: Patient reports some decrease of anxiety; patient is pleasant and cooperative; patient's interaction with staff and peers is appropriate; patient is  patient was able to set goal to talk with staff 1:1 when having feelings of SI; patient is taking medications as prescribed and tolerating medications; patient is attending all groups and engaging

## 2014-05-17 NOTE — BHH Group Notes (Signed)
   [facility] LCSW Aftercare Discharge Planning Group Note  05/17/2014  8:45 AM   Participation Quality: Alert, Appropriate and Oriented  Mood/Affect: Depressed and Flat  Depression Rating: Patient reports experiencing low depression level, not able to rate it at this time  Anxiety Rating: Patient reports experiencing low anxiety level, not able to rate it at this time  Thoughts of Suicide: Pt denies SI/HI  Will you contract for safety? Yes  Current AVH: Pt denies  Plan for Discharge/Comments: Pt attended discharge planning group and actively participated in group. CSW provided pt with today's workbook. Patient reports that she is feeling "better" since admission due to medication and support. She is happy that she is gaining weight. Patient became tearful when discussing her improvements. She is able to return home and follow up with Tamela Gammon for outpatient services.  Transportation Means: Pt reports access to transportation  Supports: Patient reports that her daughter is supportive.  Tilden Fossa, MSW, [place] Worker [facility] [phone]

## 2014-05-18 DIAGNOSIS — I10 Essential (primary) hypertension: Secondary | ICD-10-CM

## 2014-05-18 DIAGNOSIS — J45909 Unspecified asthma, uncomplicated: Secondary | ICD-10-CM

## 2014-05-18 MED ORDER — PANTOPRAZOLE SODIUM 40 MG PO TBEC: 40.0000 mg | DELAYED_RELEASE_TABLET | Freq: Every day | ORAL | Status: DC

## 2014-05-18 MED ORDER — MIRTAZAPINE 30 MG PO TABS
30.0000 mg | ORAL_TABLET | Freq: Every day | ORAL | Status: DC
Start: 2014-05-18 — End: 2017-01-19

## 2014-05-18 MED ORDER — LISINOPRIL 5 MG PO TABS: 5.0000 mg | ORAL_TABLET | Freq: Every day | ORAL | Status: DC

## 2014-05-18 MED ORDER — HYDROXYZINE HCL 25 MG PO TABS: 25.0000 mg | ORAL_TABLET | Freq: Four times a day (QID) | ORAL | Status: DC | PRN

## 2014-05-18 MED ORDER — HYDROCORTISONE 1 % EX CREA: TOPICAL_CREAM | Freq: Two times a day (BID) | CUTANEOUS | Status: DC

## 2014-05-18 MED ORDER — NICOTINE 21 MG/24HR TD PT24: 21.0000 mg | MEDICATED_PATCH | Freq: Every day | TRANSDERMAL | Status: DC

## 2014-05-18 NOTE — BHH Group Notes (Signed)
0900 nursing orientation group   The focus of this group is to educate the patient on the purpose and policies of crisis stabilization and provide a format to answer questions about their admission.  The group details unit policies and expectations of patients while admitted.   Pt was an active participant and was appropriate in sharing with the groups.

## 2014-05-18 NOTE — Progress Notes (Signed)
Discharge Note: Discharge instructions/prescriptions/medication samples given to patient. Patient verbalized understanding of discharge instructions and prescriptions. Returned belongings to patient. Denies SI/HI/AVH. Patient d/c without incident to the lobby and transported home by her daughter.

## 2014-05-18 NOTE — Progress Notes (Signed)
Pt has been observed out in the milieu and in the dayroom watching TV and interacting with peers.  Pt reports she is doing better and says her appetite is getting better.  Pt states she is worried that her daughter, who is in the [employer], may be called up before she is discharged, and pt says she has no transportation without her daughter.  She says her daughter is her sole caretaker since her health has declined.  She is hoping to get a letter to present to her daughter's commanding officer to see if she would be able to remain with the patient until she is able to be on her own.  Writer encouraged pt to discuss her concerns with her CSW in the morning.  Pt denies SI/HI/AV at this time.  She reports she has been going to groups and participating.  Pt makes her needs known to staff.  Pt is pleasant and cooperative with staff.  Support and encouragement offered.  Safety maintained with q15 minute checks.

## 2014-05-18 NOTE — BHH Suicide Risk Assessment (Signed)
Demographic Factors:  48 year old woman, lives with daughter, recently separated, employed   Total Time spent with patient: 30 minutes  Psychiatric Specialty Exam: Physical Exam  ROS  Blood pressure 112/64, pulse 76, temperature 97.7 F (36.5 C), temperature source Oral, resp. rate 16, height 4\' 10"  (1.473 m), weight 33.566 kg (74 lb), last menstrual period 03/09/2012.Body mass index is 15.47 kg/(m^2).  General Appearance: Well Groomed  Engineer, water::  Good  Speech:  Normal Rate  Volume:  Normal  Mood:  improved mood and fuller rang of affect  Affect:  Appropriate and more reactive   Thought Process:  Goal Directed and Linear  Orientation:  Full (Time, Place, and Person)  Thought Content:  no hallucinations, no delusions  Suicidal Thoughts:  No  Homicidal Thoughts:  No  Memory:  recent and remote grossly intact   Judgement:  Fair  Insight:  Fair  Psychomotor Activity:  Normal  Concentration:  Good  Recall:  Good  Fund of Knowledge:Good  Language: Good  Akathisia:  No  Handed:  Right  AIMS (if indicated):     Assets:  Communication Skills Desire for Improvement Resilience  Sleep:  Number of Hours: 6.75    Musculoskeletal: Strength & Muscle Tone: within normal limits Gait & Station: normal Patient leans: Backward   Mental Status Per Nursing Assessment::   On Admission:     Current Mental Status by Physician: At this time patient is improved, with improved mood, fuller range of affect, no thought disorder, no SI or HI, no psychotic symptoms, she is future oriented. Of note, she has better appetite and her weight has increased by 4 lbs during this admission  Loss Factors: Recent separation from husband, who left home. Financial difficulties, limited social support   Historical Factors: history of depression- prior history of alcohol abuse   Risk Reduction Factors:   Sense of responsibility to family, Living with another person, especially a relative and  Positive coping skills or problem solving skills  Continued Clinical Symptoms:  At this time mood and affect improved. Not suicidal or homicidal or psychotic. Future oriented. Behavior on unit in good control.   Cognitive Features That Contribute To Risk:  No gross cognitive deficits noted upon discharge- fully alert and attentive, oriented x 3   Suicide Risk:  Mild:  Suicidal ideation of limited frequency, intensity, duration, and specificity.  There are no identifiable plans, no associated intent, mild dysphoria and related symptoms, good self-control (both objective and subjective assessment), few other risk factors, and identifiable protective factors, including available and accessible social support.  Discharge Diagnoses:   AXIS I:  Major Depression, Severe, without psychotic features, recurrent AXIS II:  Deferred AXIS III:   Past Medical History  Diagnosis Date  . Hypertension   . Asthma   . Depression   . Anxiety    AXIS IV:  economic problems and problems related to social environment AXIS V:  51-60 moderate symptoms ( approximately 60 upon discharge)   Plan Of Care/Follow-up recommendations:  Activity:  As tolerated Diet:  Low sodium Tests:  NA Other:  See below  Is patient on multiple antipsychotic therapies at discharge:  No   Has Patient had three or more failed trials of antipsychotic monotherapy by history:  No  Recommended Plan for Multiple Antipsychotic Therapies: NA  Patient is leaving unit in good spirits. She plans to return home. Plans to [facility] up at [facility].  * Due to positive Guaiac/ hemoccult, she has an appointment  to follow up with  [place] gastroenterology on 10/20  Patient motivated in smoking cessation efforts and have encouraged her to continue this effort and maintain abstinence from cigarettes after discharge. Patient plans to attend AA meetings regularly.  COBOS, [place] 05/18/2014, 10:50 AM

## 2014-05-18 NOTE — Progress Notes (Signed)
[facility] Adult Case Management Discharge Plan :  Will you be returning to the same living situation after discharge: Yes,  patient will be returning home at discharge At discharge, do you have transportation home?:Yes,  yes patient's daughter will provide transportation Do you have the ability to pay for your medications:Yes,  patient will be provided medications samples and prescriptions at discharge.  Release of information consent forms completed and in the chart;  Patient's signature needed at discharge.  Patient to Follow up at: Follow-up Information   Follow up with Tamela Gammon On 05/22/2014. (Assessment for therapy and medication management services at 7:45 am. Please call office in advance if you need to reschedule appointment. )    Contact information:   [address] [place], [place] [address] Phone: [phone] Fax: [phone]      Follow up with Nicoletta Ba, PA-C On 05/30/2014. (9:30 am appointment at [facility])    Specialty:  Gastroenterology   Contact information:   [place]  [id number] [phone]       Patient denies SI/HI:   Yes,  denies    Safety Planning and Suicide Prevention discussed:  Yes,  with patient and daughter  Richardo Priest 05/18/2014, 1:11 PM
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
## 2014-05-22 DIAGNOSIS — F4310 Post-traumatic stress disorder, unspecified: Secondary | ICD-10-CM | POA: Insufficient documentation

## 2014-05-22 NOTE — Progress Notes (Signed)
Patient Discharge Instructions:  After Visit Summary (AVS):   Faxed to:  05/22/14 Discharge Summary Note:   Faxed to:  05/22/14 Psychiatric Admission Assessment Note:   Faxed to:  05/22/14 Suicide Risk Assessment - Discharge Assessment:   Faxed to:  05/22/14 Faxed/Sent to the Next Level Care provider:  05/22/14 Next Level Care Provider Has Access to the EMR, 05/22/14 Faxed to [facility] @ [phone] Records provided to [facility] via CH[facility]/Epic access.  Patsey Berthold, 05/22/2014, 2:42 PM

## 2014-05-30 ENCOUNTER — Encounter: Payer: Self-pay | Admitting: *Deleted

## 2014-05-30 ENCOUNTER — Ambulatory Visit: Payer: Self-pay | Admitting: Physician Assistant

## 2014-05-30 NOTE — Progress Notes (Unsigned)
Patient ID: Jamie Salinas, female   DOB: 1966/07/07, 48 y.o.   MRN: [id number] Patient did not show for her appointment with Nicoletta Ba PA-C today, 05-30-2014. Per Philis Nettle PA, she does need a follow up from recent hospital stay.  Will send letter to patient. I called today at 9:30 am and her mail box was full. I could not leave a message.

## 2017-01-19 ENCOUNTER — Ambulatory Visit: Payer: Self-pay | Admitting: Physician Assistant

## 2017-01-19 ENCOUNTER — Encounter: Payer: Self-pay | Admitting: Physician Assistant

## 2017-01-19 VITALS — BP 168/88 | HR 75 | Temp 97.9°F | Ht 58.5 in | Wt 70.5 lb

## 2017-01-19 DIAGNOSIS — Z13220 Encounter for screening for lipoid disorders: Secondary | ICD-10-CM

## 2017-01-19 DIAGNOSIS — R636 Underweight: Secondary | ICD-10-CM

## 2017-01-19 DIAGNOSIS — I10 Essential (primary) hypertension: Secondary | ICD-10-CM

## 2017-01-19 DIAGNOSIS — R062 Wheezing: Secondary | ICD-10-CM

## 2017-01-19 DIAGNOSIS — F329 Major depressive disorder, single episode, unspecified: Secondary | ICD-10-CM

## 2017-01-19 DIAGNOSIS — F32A Depression, unspecified: Secondary | ICD-10-CM

## 2017-01-19 DIAGNOSIS — J449 Chronic obstructive pulmonary disease, unspecified: Secondary | ICD-10-CM

## 2017-01-19 DIAGNOSIS — R490 Dysphonia: Secondary | ICD-10-CM

## 2017-01-19 DIAGNOSIS — F39 Unspecified mood [affective] disorder: Secondary | ICD-10-CM

## 2017-01-19 MED ORDER — LISINOPRIL 10 MG PO TABS: 10.0000 mg | ORAL_TABLET | Freq: Every day | ORAL | 1 refills | Status: DC

## 2017-01-19 MED ORDER — IPRATROPIUM-ALBUTEROL 0.5-2.5 (3) MG/3ML IN SOLN
3.0000 mL | Freq: Once | RESPIRATORY_TRACT | Status: AC
  Administered 2017-01-19: 3 mL via RESPIRATORY_TRACT

## 2017-01-19 MED ORDER — ALBUTEROL SULFATE HFA 108 (90 BASE) MCG/ACT IN AERS: 2.0000 | INHALATION_SPRAY | Freq: Four times a day (QID) | RESPIRATORY_TRACT | 1 refills | Status: DC | PRN

## 2017-01-19 NOTE — Progress Notes (Signed)
BP (!) 168/88 (BP Location: Left Arm, Patient Position: Sitting, Cuff Size: Normal)   Pulse 75   Temp 97.9 F (36.6 C)   Ht 4' 10.5" (1.486 m)   Wt 70 lb 8 oz (32 kg)   LMP 03/09/2012   SpO2 97%   BMI 14.48 kg/m    Subjective:    Patient ID: Jamie Salinas, female    DOB: June 02, 1966, 51 y.o.   MRN: [id number]  HPI: Jamie Salinas is a 51 y.o. female presenting on 01/19/2017 for New Patient (Initial Visit) (pt was previously seen at [facility]. pt has not gone in a long time. pt states she takes some of her friend's xanax.) and Hoarse (for about a year. pt states it has been getting worse throughout the time. pt states some days it is better some days worse. pt states not really painful, pt states it is more irritating. pt states having cough, congestion, and wheezing.)   HPI   Chief Complaint  Patient presents with  . New Patient (Initial Visit)    pt was previously seen at [facility]. pt has not gone in a long time. pt states she takes some of her friend's xanax.  Marland Kitchen Hoarse    for about a year. pt states it has been getting worse throughout the time. pt states some days it is better some days worse. pt states not really painful, pt states it is more irritating. pt states having cough, congestion, and wheezing.    Pt has not been to PCP in years.  Pt hasn't been to [facility] in years.    Pt has not changed much weight.  Her last weight in epic was 05/14/2015 she weighed 75 pounds.  04/15/14 was 73 pounds.   Pt works at [employer] in [place].  She works doing prep work.   Pt mother died of throat cancer  Pt has care credit that she used for some dental work recently (some type of credit care for medical expenses)  Pt says she has No dysphagia.  She says that Talking causes the throat to burn some.    Pt smokes about 1 1/2 ppd since she was about 51yo.  Relevant past medical, surgical, family and social history reviewed and updated as indicated. Interim medical history since our last  visit reviewed. Allergies and medications reviewed and updated.   Current Outpatient Prescriptions:  .  albuterol (PROAIR HFA) 108 (90 Base) MCG/ACT inhaler, Inhale 1-2 puffs into the lungs every 6 (six) hours as needed for wheezing or shortness of breath., Disp: , Rfl:  .  ALPRAZolam (XANAX PO), Take by mouth., Disp: , Rfl:   Review of Systems  Constitutional: Positive for appetite change, diaphoresis and fatigue. Negative for chills, fever and unexpected weight change.  HENT: Positive for congestion and dental problem. Negative for drooling, ear pain, facial swelling, hearing loss, mouth sores, sneezing, sore throat, trouble swallowing and voice change.   Eyes: Negative for pain, discharge, redness, itching and visual disturbance.  Respiratory: Negative for cough, choking, shortness of breath and wheezing.   Cardiovascular: Negative for chest pain, palpitations and leg swelling.  Gastrointestinal: Negative for abdominal pain, blood in stool, constipation, diarrhea and vomiting.  Endocrine: Negative for cold intolerance, heat intolerance and polydipsia.  Genitourinary: Negative for decreased urine volume, dysuria and hematuria.  Musculoskeletal: Negative for arthralgias, back pain and gait problem.  Skin: Negative for rash.  Allergic/Immunologic: Negative for environmental allergies.  Neurological: Negative for seizures, syncope, light-headedness and headaches.  Hematological: Negative for adenopathy.  Psychiatric/Behavioral: Negative for agitation, dysphoric mood and suicidal ideas. The patient is not nervous/anxious.     Per HPI unless specifically indicated above     Objective:    BP (!) 168/88 (BP Location: Left Arm, Patient Position: Sitting, Cuff Size: Normal)   Pulse 75   Temp 97.9 F (36.6 C)   Ht 4' 10.5" (1.486 m)   Wt 70 lb 8 oz (32 kg)   LMP 03/09/2012   SpO2 97%   BMI 14.48 kg/m   Wt Readings from Last 3 Encounters:  01/19/17 70 lb 8 oz (32 kg)  05/13/14 75 lb  (34 kg)  04/15/14 73 lb 2 oz (33.2 kg)    Physical Exam  Constitutional: She is oriented to person, place, and time. She appears cachectic.  Non-toxic appearance. She has a sickly appearance.  HENT:  Head: Normocephalic and atraumatic.  Mouth/Throat: Oropharynx is clear and moist. Abnormal dentition. Dental caries present. No uvula swelling. No oropharyngeal exudate.  Voice very hoarse.  Worsens with talking even small amount  Eyes: Conjunctivae and EOM are normal. Pupils are equal, round, and reactive to light.  Neck: Neck supple. No thyromegaly present.  Cardiovascular: Normal rate and regular rhythm.   Pulmonary/Chest: Effort normal. No respiratory distress. She has wheezes. She has no rales.  Abdominal: Soft. Bowel sounds are normal. She exhibits no mass. There is no hepatosplenomegaly. There is no tenderness.  Musculoskeletal: She exhibits no edema.  Lymphadenopathy:    She has no cervical adenopathy.  Neurological: She is alert and oriented to person, place, and time. Gait normal.  Skin: Skin is warm and dry.  Psychiatric: Her behavior is normal. Her mood appears anxious. Her affect is not inappropriate. Cognition and memory are normal. She exhibits a depressed mood.  Pt tearful at times  Vitals reviewed.       Assessment & Plan:    Encounter Diagnoses  Name Primary?  . Hoarseness Yes  . Wheezing   . Chronic obstructive pulmonary disease, unspecified COPD type ([place])   . Essential hypertension   . Mood disorder ([place])   . Underweight   . Depression, unspecified depression type   . Screening cholesterol level     -pt was given nebulizer treatment in office which helped her wheezing -pt was signed up for [facility]assist to get inhalers -rx lisinopril today for blood pressure -will refer to ENT for hoarseness -Gave [facility] discount application and encouraged pt to turn it in promptly -Pt given [facility] information and counseled to return for depression -gave pt information on  rcats to help with transportation problems -will Check fasting labs -counseled on smoking cessation -pt to follow up in 1 month. RTO sooner prn

## 2017-01-20 ENCOUNTER — Telehealth: Payer: Self-pay | Admitting: Physician Assistant

## 2017-01-20 NOTE — Telephone Encounter (Signed)
Pt. called in stating she started new HTN med yesterday. Since she took the med she has felt dizzy,tired, and has had a headache. Per Larene Beach pt. was advised to take 1/2 a pill for a few days and increase to a whole pill. If she doesn't fill better in a few day to call back.

## 2017-01-21 ENCOUNTER — Other Ambulatory Visit (HOSPITAL_COMMUNITY)
Admission: RE | Admit: 2017-01-21 | Discharge: 2017-01-21 | Disposition: A | Discharge status: Home / Self Care | Payer: Self-pay | Source: Ambulatory Visit | Attending: Physician Assistant | Admitting: Physician Assistant

## 2017-01-21 LAB — CBC WITH DIFFERENTIAL/PLATELET
Basophils Absolute: 0 10*3/uL (ref 0.0–0.1)
Basophils Relative: 0 %
EOS ABS: 0 10*3/uL (ref 0.0–0.7)
Eosinophils Relative: 1 %
HCT: 41.7 % (ref 36.0–46.0)
Hemoglobin: 14.1 g/dL (ref 12.0–15.0)
Lymphocytes Relative: 13 %
Lymphs Abs: 0.6 10*3/uL — ABNORMAL LOW (ref 0.7–4.0)
MCH: 33.5 pg (ref 26.0–34.0)
MCHC: 33.8 g/dL (ref 30.0–36.0)
MCV: 99 fL (ref 78.0–100.0)
MONO ABS: 0.4 10*3/uL (ref 0.1–1.0)
Monocytes Relative: 9 %
Neutro Abs: 3.5 10*3/uL (ref 1.7–7.7)
Neutrophils Relative %: 77 %
Platelets: 242 10*3/uL (ref 150–400)
RBC: 4.21 MIL/uL (ref 3.87–5.11)
RDW: 13.3 % (ref 11.5–15.5)
WBC: 4.5 10*3/uL (ref 4.0–10.5)

## 2017-01-21 LAB — COMPREHENSIVE METABOLIC PANEL
ALK PHOS: 51 U/L (ref 38–126)
ALT: 22 U/L (ref 14–54)
AST: 35 U/L (ref 15–41)
Albumin: 4.1 g/dL (ref 3.5–5.0)
Anion gap: 7 (ref 5–15)
BILIRUBIN TOTAL: 0.6 mg/dL (ref 0.3–1.2)
BUN: 15 mg/dL (ref 6–20)
CALCIUM: 9.3 mg/dL (ref 8.9–10.3)
CO2: 29 mmol/L (ref 22–32)
Chloride: 104 mmol/L (ref 101–111)
Creatinine, Ser: 0.51 mg/dL (ref 0.44–1.00)
GFR calc Af Amer: >60 mL/min (ref >60)
GFR calc non Af Amer: >60 mL/min (ref >60)
Glucose, Bld: 104 mg/dL — ABNORMAL HIGH (ref 65–99)
Potassium: 3.9 mmol/L (ref 3.5–5.1)
Sodium: 140 mmol/L (ref 135–145)
TOTAL PROTEIN: 7.2 g/dL (ref 6.5–8.1)

## 2017-01-21 LAB — LIPID PANEL
CHOLESTEROL: 171 mg/dL (ref 0–200)
HDL: 65 mg/dL (ref >40)
LDL Cholesterol: 95 mg/dL (ref 0–99)
TRIGLYCERIDES: 55 mg/dL (ref <150)
Total CHOL/HDL Ratio: 2.6 RATIO
VLDL: 11 mg/dL (ref 0–40)

## 2017-02-16 ENCOUNTER — Ambulatory Visit (INDEPENDENT_AMBULATORY_CARE_PROVIDER_SITE_OTHER): Payer: Self-pay | Admitting: Otolaryngology

## 2017-02-16 DIAGNOSIS — D380 Neoplasm of uncertain behavior of larynx: Secondary | ICD-10-CM

## 2017-02-16 DIAGNOSIS — R490 Dysphonia: Secondary | ICD-10-CM

## 2017-02-16 DIAGNOSIS — F17210 Nicotine dependence, cigarettes, uncomplicated: Secondary | ICD-10-CM

## 2017-02-17 ENCOUNTER — Encounter: Payer: Self-pay | Admitting: Physician Assistant

## 2017-02-17 ENCOUNTER — Ambulatory Visit: Payer: Self-pay | Admitting: Physician Assistant

## 2017-02-17 VITALS — BP 140/80 | HR 92 | Temp 98.1°F | Ht 58.5 in | Wt 71.0 lb

## 2017-02-17 DIAGNOSIS — F32A Depression, unspecified: Secondary | ICD-10-CM

## 2017-02-17 DIAGNOSIS — F17219 Nicotine dependence, cigarettes, with unspecified nicotine-induced disorders: Secondary | ICD-10-CM | POA: Insufficient documentation

## 2017-02-17 DIAGNOSIS — I10 Essential (primary) hypertension: Secondary | ICD-10-CM

## 2017-02-17 DIAGNOSIS — F329 Major depressive disorder, single episode, unspecified: Secondary | ICD-10-CM

## 2017-02-17 DIAGNOSIS — R490 Dysphonia: Secondary | ICD-10-CM

## 2017-02-17 DIAGNOSIS — J449 Chronic obstructive pulmonary disease, unspecified: Secondary | ICD-10-CM | POA: Insufficient documentation

## 2017-02-17 MED ORDER — VARENICLINE TARTRATE 1 MG PO TABS: 1.0000 mg | ORAL_TABLET | Freq: Two times a day (BID) | ORAL | 0 refills | Status: DC

## 2017-02-17 MED ORDER — VARENICLINE TARTRATE 0.5 MG X 11 & 1 MG X 42 PO MISC: ORAL | 0 refills | Status: DC

## 2017-02-17 MED ORDER — ALBUTEROL SULFATE (2.5 MG/3ML) 0.083% IN NEBU: 2.5000 mg | INHALATION_SOLUTION | Freq: Four times a day (QID) | RESPIRATORY_TRACT | 1 refills | Status: AC | PRN

## 2017-02-17 NOTE — Patient Instructions (Signed)
Varenicline oral tablets What is this medicine? VARENICLINE (var EN i kleen) is used to help people quit smoking. It can reduce the symptoms caused by stopping smoking. It is used with a patient support program recommended by your physician. This medicine may be used for other purposes; ask your health care provider or pharmacist if you have questions. COMMON BRAND NAME(S): Chantix What should I tell my health care provider before I take this medicine? They need to know if you have any of these conditions: -bipolar disorder, depression, schizophrenia or other mental illness -heart disease -if you often drink alcohol -kidney disease -peripheral vascular disease -seizures -stroke -suicidal thoughts, plans, or attempt; a previous suicide attempt by you or a family member -an unusual or allergic reaction to varenicline, other medicines, foods, dyes, or preservatives -pregnant or trying to get pregnant -breast-feeding How should I use this medicine? Take this medicine by mouth after eating. Take with a full glass of water. Follow the directions on the prescription label. Take your doses at regular intervals. Do not take your medicine more often than directed. There are 3 ways you can use this medicine to help you quit smoking; talk to your health care professional to decide which plan is right for you: 1) you can choose a quit date and start this medicine 1 week before the quit date, or, 2) you can start taking this medicine before you choose a quit date, and then pick a quit date between day 8 and 35 days of treatment, or, 3) if you are not sure that you are able or willing to quit smoking right away, start taking this medicine and slowly decrease the amount you smoke as directed by your health care professional with the goal of being cigarette-free by week 12 of treatment. Stick to your plan; ask about support groups or other ways to help you remain cigarette-free. If you are motivated to quit  smoking and did not succeed during a previous attempt with this medicine for reasons other than side effects, or if you returned to smoking after this treatment, speak with your health care professional about whether another course of this medicine may be right for you. A special MedGuide will be given to you by the pharmacist with each prescription and refill. Be sure to read this information carefully each time. Talk to your pediatrician regarding the use of this medicine in children. This medicine is not approved for use in children. Overdosage: If you think you have taken too much of this medicine contact a poison control center or emergency room at once. NOTE: This medicine is only for you. Do not share this medicine with others. What if I miss a dose? If you miss a dose, take it as soon as you can. If it is almost time for your next dose, take only that dose. Do not take double or extra doses. What may interact with this medicine? -alcohol or any product that contains alcohol -insulin -other stop smoking aids -theophylline -warfarin This list may not describe all possible interactions. Give your health care provider a list of all the medicines, herbs, non-prescription drugs, or dietary supplements you use. Also tell them if you smoke, drink alcohol, or use illegal drugs. Some items may interact with your medicine. What should I watch for while using this medicine? Visit your doctor or health care professional for regular check ups. Ask for ongoing advice and encouragement from your doctor or healthcare professional, friends, and family to help you quit. If   you smoke while on this medication, quit again Your mouth may get dry. Chewing sugarless gum or sucking hard candy, and drinking plenty of water may help. Contact your doctor if the problem does not go away or is severe. You may get drowsy or dizzy. Do not drive, use machinery, or do anything that needs mental alertness until you know how  this medicine affects you. Do not stand or sit up quickly, especially if you are an older patient. This reduces the risk of dizzy or fainting spells. Sleepwalking can happen during treatment with this medicine, and can sometimes lead to behavior that is harmful to you, other people, or property. Stop taking this medicine and tell your doctor if you start sleepwalking or have other unusual sleep-related activity. Decrease the amount of alcoholic beverages that you drink during treatment with this medicine until you know if this medicine affects your ability to tolerate alcohol. Some people have experienced increased drunkenness (intoxication), unusual or sometimes aggressive behavior, or no memory of things that have happened (amnesia) during treatment with this medicine. The use of this medicine may increase the chance of suicidal thoughts or actions. Pay special attention to how you are responding while on this medicine. Any worsening of mood, or thoughts of suicide or dying should be reported to your health care professional right away. What side effects may I notice from receiving this medicine? Side effects that you should report to your doctor or health care professional as soon as possible: -allergic reactions like skin rash, itching or hives, swelling of the face, lips, tongue, or throat -acting aggressive, being angry or violent, or acting on dangerous impulses -breathing problems -changes in vision -chest pain or chest tightness -confusion, trouble speaking or understanding -new or worsening depression, anxiety, or panic attacks -extreme increase in activity and talking (mania) -fast, irregular heartbeat -feeling faint or lightheaded, falls -fever -pain in legs when walking -problems with balance, talking, walking -redness, blistering, peeling or loosening of the skin, including inside the mouth -ringing in ears -seeing or hearing things that aren't there  (hallucinations) -seizures -sleepwalking -sudden numbness or weakness of the face, arm or leg -thoughts about suicide or dying, or attempts to commit suicide -trouble passing urine or change in the amount of urine -unusual bleeding or bruising -unusually weak or tired Side effects that usually do not require medical attention (report to your doctor or health care professional if they continue or are bothersome): -constipation -headache -nausea, vomiting -strange dreams -stomach gas -trouble sleeping This list may not describe all possible side effects. Call your doctor for medical advice about side effects. You may report side effects to FDA at 1-800-FDA-1088. Where should I keep my medicine? Keep out of the reach of children. Store at room temperature between 15 and 30 degrees C (59 and 86 degrees F). Throw away any unused medicine after the expiration date. NOTE: This sheet is a summary. It may not cover all possible information. If you have questions about this medicine, talk to your doctor, pharmacist, or health care provider.  2018 Elsevier/Gold Standard (2015-04-12 16:14:23)  

## 2017-02-17 NOTE — Progress Notes (Signed)
BP 140/80 (BP Location: Right Arm, Patient Position: Sitting, Cuff Size: Small)   Pulse 92   Temp 98.1 F (36.7 C)   Ht 4' 10.5" (1.486 m)   Wt 71 lb (32.2 kg)   LMP 03/09/2012   SpO2 97%   BMI 14.59 kg/m    Subjective:    Patient ID: Jamie Salinas, female    DOB: 25-Mar-1966, 51 y.o.   MRN: [id number]  HPI: Jamie Salinas is a 51 y.o. female presenting on 02/17/2017 for Hoarse (pt states she went to ENT yesterday); Wheezing (pt would like to discuss options on getting a nebulizer machine); and Nicotine Dependence (pt would like to discuss smoking cessation)   HPI   Chief Complaint  Patient presents with  . Hoarse    pt states she went to ENT yesterday  . Wheezing    pt would like to discuss options on getting a nebulizer machine  . Nicotine Dependence    pt would like to discuss smoking cessation     Pt went to ENT yesterday and says that she was told she has throat cancer.  She is Scheduled for 2 wk from today for biopsy to confirm.  Pt has not yet had an opportunity to get to [facility] as she has been busy but says she is planning to go.  she  Says she is still upset and trying to get a grip on the news she received yesterday.    Pt has only been taking 1/2 lisinopril daily because it makes her sleepy.   Pt's friend is with her again today.    Relevant past medical, surgical, family and social history reviewed and updated as indicated. Interim medical history since our last visit reviewed. Allergies and medications reviewed and updated.   Current Outpatient Prescriptions:  .  albuterol (PROVENTIL HFA;VENTOLIN HFA) 108 (90 Base) MCG/ACT inhaler, Inhale 2 puffs into the lungs every 6 (six) hours as needed for wheezing or shortness of breath., Disp: 3 Inhaler, Rfl: 1 .  ALPRAZolam (XANAX PO), Take 1 tablet by mouth as needed. , Disp: , Rfl:  .  lisinopril (PRINIVIL,ZESTRIL) 10 MG tablet, Take 1 tablet (10 mg total) by mouth daily. (Patient taking differently: Take 5 mg  by mouth daily. ), Disp: 30 tablet, Rfl: 1   Review of Systems  Constitutional: Positive for fatigue and unexpected weight change. Negative for appetite change, chills, diaphoresis and fever.  HENT: Positive for congestion, dental problem, drooling, ear pain, facial swelling, sore throat, trouble swallowing and voice change. Negative for hearing loss, mouth sores and sneezing.   Eyes: Positive for itching. Negative for pain, discharge, redness and visual disturbance.  Respiratory: Positive for cough, choking, shortness of breath and wheezing.   Cardiovascular: Positive for palpitations. Negative for chest pain and leg swelling.  Gastrointestinal: Negative for abdominal pain, blood in stool, constipation, diarrhea and vomiting.  Endocrine: Positive for polydipsia. Negative for cold intolerance and heat intolerance.  Genitourinary: Negative for decreased urine volume, dysuria and hematuria.  Musculoskeletal: Negative for arthralgias, back pain and gait problem.  Skin: Negative for rash.  Allergic/Immunologic: Positive for environmental allergies.  Neurological: Negative for seizures, syncope, light-headedness and headaches.  Hematological: Positive for adenopathy.  Psychiatric/Behavioral: Positive for agitation and dysphoric mood. Negative for suicidal ideas. The patient is nervous/anxious.     Per HPI unless specifically indicated above     Objective:    BP 140/80 (BP Location: Right Arm, Patient Position: Sitting, Cuff Size: Small)   Pulse  92   Temp 98.1 F (36.7 C)   Ht 4' 10.5" (1.486 m)   Wt 71 lb (32.2 kg)   LMP 03/09/2012   SpO2 97%   BMI 14.59 kg/m   Wt Readings from Last 3 Encounters:  02/17/17 71 lb (32.2 kg)  01/19/17 70 lb 8 oz (32 kg)  05/13/14 75 lb (34 kg)    Physical Exam  Constitutional: She is oriented to person, place, and time.  HENT:  Head: Normocephalic and atraumatic.  Neck: Neck supple.  Cardiovascular: Normal rate and regular rhythm.    Pulmonary/Chest: Effort normal. No respiratory distress. She has wheezes. She has no rales.  Musculoskeletal: She exhibits no edema.  Neurological: She is alert and oriented to person, place, and time.  Skin: Skin is warm and dry.  Psychiatric: She has a normal mood and affect. Her behavior is normal. Judgment and thought content normal.  Nursing note and vitals reviewed.   Results for orders placed or performed during the hospital encounter of 01/21/17  Lipid panel  Result Value Ref Range   Cholesterol 171 0 - 200 mg/dL   Triglycerides 55 <150 mg/dL   HDL 65 >40 mg/dL   Total CHOL/HDL Ratio 2.6 RATIO   VLDL 11 0 - 40 mg/dL   LDL Cholesterol 95 0 - 99 mg/dL  Comprehensive metabolic panel  Result Value Ref Range   Sodium 140 135 - 145 mmol/L   Potassium 3.9 3.5 - 5.1 mmol/L   Chloride 104 101 - 111 mmol/L   CO2 29 22 - 32 mmol/L   Glucose, Bld 104 (H) 65 - 99 mg/dL   BUN 15 6 - 20 mg/dL   Creatinine, Ser 0.51 0.44 - 1.00 mg/dL   Calcium 9.3 8.9 - 10.3 mg/dL   Total Protein 7.2 6.5 - 8.1 g/dL   Albumin 4.1 3.5 - 5.0 g/dL   AST 35 15 - 41 U/L   ALT 22 14 - 54 U/L   Alkaline Phosphatase 51 38 - 126 U/L   Total Bilirubin 0.6 0.3 - 1.2 mg/dL   GFR calc non Af Amer >60 >60 mL/min   GFR calc Af Amer >60 >60 mL/min   Anion gap 7 5 - 15  CBC with Differential/Platelet  Result Value Ref Range   WBC 4.5 4.0 - 10.5 K/uL   RBC 4.21 3.87 - 5.11 MIL/uL   Hemoglobin 14.1 12.0 - 15.0 g/dL   HCT 41.7 36.0 - 46.0 %   MCV 99.0 78.0 - 100.0 fL   MCH 33.5 26.0 - 34.0 pg   MCHC 33.8 30.0 - 36.0 g/dL   RDW 13.3 11.5 - 15.5 %   Platelets 242 150 - 400 K/uL   Neutrophils Relative % 77 %   Neutro Abs 3.5 1.7 - 7.7 K/uL   Lymphocytes Relative 13 %   Lymphs Abs 0.6 (L) 0.7 - 4.0 K/uL   Monocytes Relative 9 %   Monocytes Absolute 0.4 0.1 - 1.0 K/uL   Eosinophils Relative 1 %   Eosinophils Absolute 0.0 0.0 - 0.7 K/uL   Basophils Relative 0 %   Basophils Absolute 0.0 0.0 - 0.1 K/uL       Assessment & Plan:   Encounter Diagnoses  Name Primary?  . Essential hypertension Yes  . Chronic obstructive pulmonary disease, unspecified COPD type ([place])   . Hoarseness   . Cigarette nicotine dependence with nicotine-induced disorder   . Depression, unspecified depression type      -reviewed labs with pt. -pt  to start Taking whole lisinopril tablet.  She can take it in the evening if she finds that it makes her sleepy -discussed with pt that pharmacies like [facility] and perhaps [facility].  She could also order one online.  She should not need Rx for this since she will be paying out of pocket.  She should contact office if she finds that a Rx is requested by the pharmacy.  Albuterol for nebulizer Rx is sent to [facility] -Gave another card for [facility] and encouraged her to go for counseling at this difficult time.  Pt has no SI or HI.  She has some depression baseline with the acute sadness from the CA diagnosis.   -counseled at length about smoking cessation and that her treatment will go better if she stops the smoking.  Discussed chantix including risks and benefits including mood changes and nightmares and pt wishes to try this medication.  AVS with more information on chantix is mailed to pt (pt seen in [place] office today where there is no printer).  Discussed with pt proper method for using chantix including picking a quit date and starting rx 1 week prior to the quit date.  Pt is told to notify office right away for any problems associated with the chantix.   -recommended that pt apply for medicaid in light of the throat cancer diagnosis -pt to follow up with dr Benjamine Mola for the biopsy as scheduled.   -pt to follow up in one month to recheck the BP.  She is to RTO sooner prn

## 2017-02-18 ENCOUNTER — Other Ambulatory Visit: Payer: Self-pay | Admitting: Otolaryngology

## 2017-02-25 ENCOUNTER — Encounter (HOSPITAL_BASED_OUTPATIENT_CLINIC_OR_DEPARTMENT_OTHER): Payer: Self-pay | Admitting: *Deleted

## 2017-03-03 ENCOUNTER — Encounter (HOSPITAL_BASED_OUTPATIENT_CLINIC_OR_DEPARTMENT_OTHER): Payer: Self-pay | Admitting: Anesthesiology

## 2017-03-03 ENCOUNTER — Ambulatory Visit (HOSPITAL_BASED_OUTPATIENT_CLINIC_OR_DEPARTMENT_OTHER): Payer: Medicaid Other | Admitting: Anesthesiology

## 2017-03-03 ENCOUNTER — Encounter (HOSPITAL_BASED_OUTPATIENT_CLINIC_OR_DEPARTMENT_OTHER)
Admission: RE | Disposition: A | Discharge status: Home / Self Care | Payer: Self-pay | Source: Ambulatory Visit | Attending: Otolaryngology

## 2017-03-03 ENCOUNTER — Ambulatory Visit (HOSPITAL_BASED_OUTPATIENT_CLINIC_OR_DEPARTMENT_OTHER)
Admission: RE | Admit: 2017-03-03 | Discharge: 2017-03-03 | Disposition: A | Discharge status: Home / Self Care | Payer: Medicaid Other | Source: Ambulatory Visit | Attending: Otolaryngology | Admitting: Otolaryngology

## 2017-03-03 DIAGNOSIS — C321 Malignant neoplasm of supraglottis: Secondary | ICD-10-CM | POA: Diagnosis not present

## 2017-03-03 DIAGNOSIS — F17210 Nicotine dependence, cigarettes, uncomplicated: Secondary | ICD-10-CM | POA: Insufficient documentation

## 2017-03-03 DIAGNOSIS — Z7951 Long term (current) use of inhaled steroids: Secondary | ICD-10-CM | POA: Insufficient documentation

## 2017-03-03 DIAGNOSIS — F419 Anxiety disorder, unspecified: Secondary | ICD-10-CM | POA: Insufficient documentation

## 2017-03-03 DIAGNOSIS — I10 Essential (primary) hypertension: Secondary | ICD-10-CM | POA: Diagnosis not present

## 2017-03-03 DIAGNOSIS — F329 Major depressive disorder, single episode, unspecified: Secondary | ICD-10-CM | POA: Diagnosis not present

## 2017-03-03 DIAGNOSIS — J42 Unspecified chronic bronchitis: Secondary | ICD-10-CM | POA: Insufficient documentation

## 2017-03-03 DIAGNOSIS — Z800 Family history of malignant neoplasm of digestive organs: Secondary | ICD-10-CM | POA: Insufficient documentation

## 2017-03-03 DIAGNOSIS — K219 Gastro-esophageal reflux disease without esophagitis: Secondary | ICD-10-CM | POA: Diagnosis not present

## 2017-03-03 DIAGNOSIS — D380 Neoplasm of uncertain behavior of larynx: Secondary | ICD-10-CM

## 2017-03-03 DIAGNOSIS — Z79899 Other long term (current) drug therapy: Secondary | ICD-10-CM | POA: Insufficient documentation

## 2017-03-03 DIAGNOSIS — J387 Other diseases of larynx: Secondary | ICD-10-CM | POA: Diagnosis present

## 2017-03-03 HISTORY — DX: Adverse effect of unspecified anesthetic, initial encounter: T41.45XA

## 2017-03-03 HISTORY — PX: MICROLARYNGOSCOPY: SHX5208

## 2017-03-03 HISTORY — DX: Other specified postprocedural states: Z98.890

## 2017-03-03 HISTORY — DX: Other complications of anesthesia, initial encounter: T88.59XA

## 2017-03-03 HISTORY — DX: Other specified postprocedural states: R11.2

## 2017-03-03 SURGERY — MICROLARYNGOSCOPY
Anesthesia: General

## 2017-03-03 MED ORDER — MIDAZOLAM HCL 2 MG/2ML IJ SOLN
INTRAMUSCULAR | Status: AC
  Filled 2017-03-03: qty 2

## 2017-03-03 MED ORDER — LACTATED RINGERS IV SOLN
INTRAVENOUS | Status: DC
  Administered 2017-03-03: 11:00:00 via INTRAVENOUS

## 2017-03-03 MED ORDER — LIDOCAINE HCL (CARDIAC) 20 MG/ML IV SOLN
INTRAVENOUS | Status: DC | PRN
  Administered 2017-03-03: 40 mg via INTRAVENOUS

## 2017-03-03 MED ORDER — LIDOCAINE 2% (20 MG/ML) 5 ML SYRINGE
INTRAMUSCULAR | Status: AC
  Filled 2017-03-03: qty 5

## 2017-03-03 MED ORDER — CLINDAMYCIN PHOSPHATE 600 MG/50ML IV SOLN
INTRAVENOUS | Status: AC
  Filled 2017-03-03: qty 50

## 2017-03-03 MED ORDER — ONDANSETRON HCL 4 MG/2ML IJ SOLN
4.0000 mg | Freq: Once | INTRAMUSCULAR | Status: AC
  Administered 2017-03-03: 4 mg via INTRAVENOUS

## 2017-03-03 MED ORDER — DEXAMETHASONE SODIUM PHOSPHATE 4 MG/ML IJ SOLN
INTRAMUSCULAR | Status: DC | PRN
  Administered 2017-03-03: 10 mg via INTRAVENOUS

## 2017-03-03 MED ORDER — FENTANYL CITRATE (PF) 100 MCG/2ML IJ SOLN
50.0000 ug | INTRAMUSCULAR | Status: DC | PRN
  Administered 2017-03-03 (×2): 50 ug via INTRAVENOUS

## 2017-03-03 MED ORDER — FENTANYL CITRATE (PF) 100 MCG/2ML IJ SOLN
INTRAMUSCULAR | Status: AC
  Filled 2017-03-03: qty 2

## 2017-03-03 MED ORDER — SUCCINYLCHOLINE CHLORIDE 20 MG/ML IJ SOLN
INTRAMUSCULAR | Status: DC | PRN
  Administered 2017-03-03: 90 mg via INTRAVENOUS

## 2017-03-03 MED ORDER — OXYCODONE-ACETAMINOPHEN 5-325 MG PO TABS: 1.0000 | ORAL_TABLET | Freq: Four times a day (QID) | ORAL | 0 refills | Status: DC | PRN

## 2017-03-03 MED ORDER — PROPOFOL 10 MG/ML IV BOLUS
INTRAVENOUS | Status: DC | PRN
  Administered 2017-03-03: 100 mg via INTRAVENOUS

## 2017-03-03 MED ORDER — ONDANSETRON HCL 4 MG/2ML IJ SOLN
INTRAMUSCULAR | Status: DC | PRN
  Administered 2017-03-03: 4 mg via INTRAVENOUS

## 2017-03-03 MED ORDER — PROMETHAZINE HCL 25 MG/ML IJ SOLN
6.2500 mg | INTRAMUSCULAR | Status: DC | PRN
Start: 2017-03-03 — End: 2017-03-03

## 2017-03-03 MED ORDER — SCOPOLAMINE 1 MG/3DAYS TD PT72: 1.0000 | MEDICATED_PATCH | Freq: Once | TRANSDERMAL | Status: DC | PRN

## 2017-03-03 MED ORDER — HYDROMORPHONE HCL 1 MG/ML IJ SOLN
0.2500 mg | INTRAMUSCULAR | Status: DC | PRN
  Administered 2017-03-03: 0.25 mg via INTRAVENOUS

## 2017-03-03 MED ORDER — ALBUTEROL SULFATE (2.5 MG/3ML) 0.083% IN NEBU
INHALATION_SOLUTION | RESPIRATORY_TRACT | Status: AC
  Filled 2017-03-03: qty 3

## 2017-03-03 MED ORDER — ALBUTEROL SULFATE (2.5 MG/3ML) 0.083% IN NEBU
2.5000 mg | INHALATION_SOLUTION | Freq: Four times a day (QID) | RESPIRATORY_TRACT | Status: DC | PRN
  Administered 2017-03-03: 2.5 mg via RESPIRATORY_TRACT

## 2017-03-03 MED ORDER — HYDROMORPHONE HCL 1 MG/ML IJ SOLN
INTRAMUSCULAR | Status: AC
  Filled 2017-03-03: qty 0.5

## 2017-03-03 MED ORDER — ONDANSETRON HCL 4 MG/2ML IJ SOLN
INTRAMUSCULAR | Status: AC
  Filled 2017-03-03: qty 2

## 2017-03-03 MED ORDER — MIDAZOLAM HCL 2 MG/2ML IJ SOLN
1.0000 mg | INTRAMUSCULAR | Status: DC | PRN
  Administered 2017-03-03: 2 mg via INTRAVENOUS

## 2017-03-03 MED ORDER — SCOPOLAMINE 1 MG/3DAYS TD PT72: 1.0000 | MEDICATED_PATCH | TRANSDERMAL | Status: DC

## 2017-03-03 MED ORDER — DEXAMETHASONE SODIUM PHOSPHATE 10 MG/ML IJ SOLN
INTRAMUSCULAR | Status: AC
  Filled 2017-03-03: qty 1

## 2017-03-03 MED ORDER — EPINEPHRINE PF 1 MG/ML IJ SOLN
INTRAMUSCULAR | Status: DC | PRN
Start: 2017-03-03 — End: 2017-03-03
  Administered 2017-03-03: 1 mg

## 2017-03-03 MED ORDER — CLINDAMYCIN PHOSPHATE 600 MG/50ML IV SOLN
INTRAVENOUS | Status: DC | PRN
  Administered 2017-03-03: 600 mg via INTRAVENOUS

## 2017-03-03 SURGICAL SUPPLY — 24 items
CANISTER SUCT 1200ML W/VALVE (MISCELLANEOUS) ×3 IMPLANT
GAUZE SPONGE 4X4 12PLY STRL LF (GAUZE/BANDAGES/DRESSINGS) ×3 IMPLANT
GLOVE BIO SURGEON STRL SZ 6.5 (GLOVE) ×1 IMPLANT
GLOVE BIO SURGEON STRL SZ7.5 (GLOVE) ×3 IMPLANT
GLOVE BIO SURGEONS STRL SZ 6.5 (GLOVE) ×1
GOWN STRL REUS W/ TWL LRG LVL3 (GOWN DISPOSABLE) ×1 IMPLANT
GOWN STRL REUS W/TWL LRG LVL3 (GOWN DISPOSABLE) ×3
GUARD TEETH (MISCELLANEOUS) ×3 IMPLANT
MARKER SKIN DUAL TIP RULER LAB (MISCELLANEOUS) ×3 IMPLANT
NDL SAFETY ECLIPSE 18X1.5 (NEEDLE) ×1 IMPLANT
NDL SPNL 22GX7 QUINCKE BK (NEEDLE) IMPLANT
NEEDLE HYPO 18GX1.5 SHARP (NEEDLE) ×3
NEEDLE SPNL 22GX7 QUINCKE BK (NEEDLE) IMPLANT
NS IRRIG 1000ML POUR BTL (IV SOLUTION) ×3 IMPLANT
PACK BASIN DAY SURGERY FS (CUSTOM PROCEDURE TRAY) ×3 IMPLANT
PATTIES SURGICAL .5 X3 (DISPOSABLE) ×3 IMPLANT
SHEET MEDIUM DRAPE 40X70 STRL (DRAPES) ×3 IMPLANT
SLEEVE SCD COMPRESS KNEE MED (MISCELLANEOUS) ×3 IMPLANT
SOLUTION BUTLER CLEAR DIP (MISCELLANEOUS) ×3 IMPLANT
SYR CONTROL 10ML LL (SYRINGE) ×3 IMPLANT
SYR TB 1ML LL NO SAFETY (SYRINGE) ×3 IMPLANT
TOWEL OR 17X24 6PK STRL BLUE (TOWEL DISPOSABLE) ×3 IMPLANT
TUBE CONNECTING 20'X1/4 (TUBING) ×1
TUBE CONNECTING 20X1/4 (TUBING) ×2 IMPLANT

## 2017-03-03 NOTE — Anesthesia Procedure Notes (Signed)
Procedure Name: Intubation Performed by: Terrance Mass Pre-anesthesia Checklist: Patient identified, Emergency Drugs available, Suction available and Patient being monitored Patient Re-evaluated:Patient Re-evaluated prior to induction Oxygen Delivery Method: Circle system utilized Preoxygenation: Pre-oxygenation with 100% oxygen Induction Type: IV induction Ventilation: Mask ventilation without difficulty Laryngoscope Size: Miller and 2 Grade View: Grade I Tube type: Oral Tube size: 6.5 mm Number of attempts: 1 Airway Equipment and Method: Stylet and Oral airway Placement Confirmation: ETT inserted through vocal cords under direct vision,  positive ETCO2 and breath sounds checked- equal and bilateral Secured at: 22 cm Tube secured with: Tape Dental Injury: Teeth and Oropharynx as per pre-operative assessment

## 2017-03-03 NOTE — H&P (Signed)
Cc: Hoarseness  HPI: The patient is a 51 year old female who presents today for evaluation of her chronic hoarseness.  The patient is seen in consultation requested by [facility]. According to the patient, her hoarseness has progressively worsened over the past 2 years. She also complains of persistent burning sensation in her throat.  She reports occasional radiating pain to her right ear.  Currently she denies any significant dysphagia or odynophagia.  The patient has lost 20 lb over the past years intentionally.  The patient has a long history of tobacco use.  She has been smoking for more than 40 years.  Currently she smokes 1 pack per day.  She has a history of COPD and emphysema.  Over the past year, she was treated for multiple dental infections with oral antibiotics.  She has no previous history of ENT surgery.   The patient's review of systems (constitutional, eyes, ENT, cardiovascular, respiratory, GI, musculoskeletal, skin, neurologic, psychiatric, endocrine, hematologic, allergic) is noted in the ROS questionnaire.  It is reviewed with the patient.  Family health history: Throat cancer.  Major events: C-section.  Ongoing medical problems: Weight loss, hypertension, reflux, chronic bronchitis, arthritis, depression, anxiety, allergies.  Social history: The patient is married. She smokes one pack of cigarettes a day. She seldom drinks alcohol . She denies the use of illegal drugs.  Exam General: Communicates without difficulty, thin and cachetic, no acute distress. Head: Normocephalic, no evidence injury, no tenderness, facial buttresses intact without stepoff. Face/sinus: No tenderness to palpation and percussion. Facial movement is normal and symmetric. Eyes: PERRL, EOMI. No scleral icterus, conjunctivae clear. Neuro: CN II exam reveals vision grossly intact.  No nystagmus at any point of gaze. Ears: Auricles well formed without lesions.  Ear canals are intact without  mass or lesion.  No erythema or edema is appreciated.  The TMs are intact without fluid. Nose: External evaluation reveals normal support and skin without lesions.  Dorsum is intact.  Anterior rhinoscopy reveals healthy pink mucosa over anterior aspect of inferior turbinates and intact septum.  No purulence noted. Oral:  Oral cavity and oropharynx are intact, symmetric, without erythema or edema.  Mucosa is moist without lesions. Neck: Full range of motion without pain.  There is no significant lymphadenopathy.  No masses palpable.  Thyroid bed within normal limits to palpation.  Parotid glands and submandibular glands equal bilaterally without mass.  Trachea is midline. Neuro:  CN 2-12 grossly intact. Gait normal.   Procedure:  Flexible Fiberoptic Laryngoscopy Risks, benefits, and alternatives of flexible endoscopy were explained to the patient.  Specific mention was made of the risk of throat numbness with difficulty swallowing, possible bleeding from the nose and mouth, and pain from the procedure.  The patient gave oral consent to proceed.  The nasal cavities were decongested and anesthetised with a combination of oxymetazoline and 4% lidocaine solution.  The flexible scope was inserted into the right nasal cavity and advanced towards the nasopharynx.  Visualized mucosa over the turbinates and septum were as described above.  The nasopharynx was clear.  Oropharyngeal walls were symmetric and mobile without lesion, mass, or edema.  Hypopharynx was also without  lesion or edema.  The patient was noted to have a fungating mass, occupying the right aryepiglottic fold.  The mass appeared to involve the right vocal cord.   Base of tongue was within normal limits.   Assessment 1.  The patient is noted to have a fungating mass, occupying the right aryepiglottic  fold.  The mass appears to involve the right vocal cord.   2.  The patient's hoarseness is likely a result of the laryngeal mass.  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   3.  Chronic tobacco dependence.   Plan  1.  The physical exam and laryngoscopy findings are reviewed with the patient.   2.  The likely diagnosis of squamous cell carcinoma is discussed.  The patient will need to undergo direct laryngoscopy and biopsy of the laryngeal mass.  The risks, benefits, alternatives and details of the procedure are reviewed.  3.  We will schedule the procedure as soon as possible.

## 2017-03-03 NOTE — Anesthesia Preprocedure Evaluation (Signed)
Anesthesia Evaluation  Patient identified by MRN, date of birth, ID band Patient awake    Reviewed: Allergy & Precautions, NPO status , Patient's Chart, lab work & pertinent test results  History of Anesthesia Complications (+) PONVNegative for: history of anesthetic complications  Airway Mallampati: II  TM Distance: >3 FB Neck ROM: Full    Dental  (+) Poor Dentition, Dental Advisory Given   Pulmonary asthma , COPD, Current Smoker,    Pulmonary exam normal        Cardiovascular hypertension, negative cardio ROS Normal cardiovascular exam     Neuro/Psych PSYCHIATRIC DISORDERS Anxiety Depression negative neurological ROS  negative psych ROS   GI/Hepatic Neg liver ROS, GERD  ,  Endo/Other  negative endocrine ROS  Renal/GU negative Renal ROS  negative genitourinary   Musculoskeletal negative musculoskeletal ROS (+)   Abdominal   Peds negative pediatric ROS (+)  Hematology negative hematology ROS (+)   Anesthesia Other Findings   Reproductive/Obstetrics negative OB ROS                             Anesthesia Physical Anesthesia Plan  ASA: III  Anesthesia Plan: General   Post-op Pain Management:    Induction: Intravenous  PONV Risk Score and Plan: 3 and Ondansetron, Dexamethasone and Scopolamine patch - Pre-op  Airway Management Planned: Oral ETT  Additional Equipment:   Intra-op Plan:   Post-operative Plan: Extubation in OR  Informed Consent: I have reviewed the patients History and Physical, chart, labs and discussed the procedure including the risks, benefits and alternatives for the proposed anesthesia with the patient or authorized representative who has indicated his/her understanding and acceptance.   Dental advisory given  Plan Discussed with: CRNA, Anesthesiologist and Surgeon  Anesthesia Plan Comments:         Anesthesia Quick Evaluation

## 2017-03-03 NOTE — Op Note (Signed)
DATE OF PROCEDURE:  03/03/2017                              OPERATIVE REPORT  SURGEON:  Leta Baptist, MD  PREOPERATIVE DIAGNOSES: 1. Right supraglottic mass  POSTOPERATIVE DIAGNOSES: 1. Right supraglottic mass 2. Edematous left vocal cord.  PROCEDURE PERFORMED:  MicroDirect laryngoscopy with biopsy  ANESTHESIA:  General endotracheal tube anesthesia.  COMPLICATIONS:  None.  ESTIMATED BLOOD LOSS:  Minimal.  INDICATION FOR PROCEDURE:  Jamie Salinas is a 51 y.o. female with a history of progressive hoarseness and throat pain for the past 2 years. The patient has also lost 20 pounds over the past year. On examination, she was noted to have a fungating mass occupying the right aryepiglottic fold. The mass appeared to involve the right vocal cord. The findings were concerning for malignancy. Based on the above findings, the decision was made for the patient to undergo the  above-stated procedure.   The risks, benefits, alternatives, and details of the procedure were discussed with the patient.  Questions were invited and answered.  Informed consent was obtained.  DESCRIPTION:  The patient was taken to the operating room and placed supine on the operating table.  General endotracheal tube anesthesia was administered by the anesthesiologist.  The patient was positioned and prepped and draped in a standard fashion for direct laryngoscopy and biopsy.  A Dedo laryngoscope was used for examination. The laryngoscope was inserted via the oral cavity into the pharynx. The vallecula, epiglottis, and piriform sinuses were normal. However a fungating mass was noted to erode and occupied the right aryepiglottic fold. The mass appeared to extend down to the right vocal cord. The left vocal cord was noted to be edematous. The Dedo laryngoscope was suspended. A 0 scope was used to examine the larynx. Photodocumentation was obtained. Multiple biopsy specimens were obtained from the right aryepiglottic fold mass.  Biopsy specimens were also obtained from the left edematous vocal cord. Hemostasis was achieved with pledgets soaked with epinephrine.  The care of the patient was turned over to the anesthesiologist.  The patient was awakened from anesthesia without difficulty.  The patient was extubated and transferred to the recovery room in good condition.  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 The mass has eroded the right apical aryepiglottic fold, and extended down to the right vocal cord. The left vocal cord was edematous.  SPECIMEN:  Biopsy specimens.   FOLLOWUP CARE:  The patient will be discharged home once awake and alert.  The patient will follow up in my office in approximately 1 week.  Kayvon Mo W Verline Kong 03/03/2017 12:06 PM

## 2017-03-03 NOTE — Transfer of Care (Signed)
Immediate Anesthesia Transfer of Care Note  Patient: Jamie Salinas  Procedure(s) Performed: Procedure(s): MICROLARYNGOSCOPY WITH BIOPSY (N/A)  Patient Location: PACU  Anesthesia Type:General  Level of Consciousness: awake and sedated  Airway & Oxygen Therapy: Patient Spontanous Breathing and Patient connected to face mask oxygen  Post-op Assessment: Report given to RN and Post -op Vital signs reviewed and stable  Post vital signs: Reviewed and stable  Last Vitals:  Vitals:   03/03/17 1000  BP: (!) 148/82  Pulse: 90  Resp: 20  Temp: 36.8 C    Last Pain:  Vitals:   03/03/17 1000  TempSrc: Oral         Complications: No apparent anesthesia complications

## 2017-03-03 NOTE — Discharge Instructions (Addendum)
The patient may resume all her previous activities and diet. She'll follow-up in my office in one week.  Post Anesthesia Home Care Instructions  Activity: Get plenty of rest for the remainder of the day. A responsible individual must stay with you for 24 hours following the procedure.  For the next 24 hours, DO NOT: -Drive a car -Paediatric nurse -Drink alcoholic beverages -Take any medication unless instructed by your physician -Make any legal decisions or sign important papers.  Meals: Start with liquid foods such as gelatin or soup. Progress to regular foods as tolerated. Avoid greasy, spicy, heavy foods. If nausea and/or vomiting occur, drink only clear liquids until the nausea and/or vomiting subsides. Call your physician if vomiting continues.  Special Instructions/Symptoms: Your throat may feel dry or sore from the anesthesia or the breathing tube placed in your throat during surgery. If this causes discomfort, gargle with warm salt water. The discomfort should disappear within 24 hours.  If you had a scopolamine patch placed behind your ear for the management of post- operative nausea and/or vomiting:  1. The medication in the patch is effective for 72 hours, after which it should be removed.  Wrap patch in a tissue and discard in the trash. Wash hands thoroughly with soap and water. 2. You may remove the patch earlier than 72 hours if you experience unpleasant side effects which may include dry mouth, dizziness or visual disturbances. 3. Avoid touching the patch. Wash your hands with soap and water after contact with the patch.

## 2017-03-03 NOTE — Anesthesia Postprocedure Evaluation (Signed)
Anesthesia Post Note  Patient: Jamie Salinas  Procedure(s) Performed: Procedure(s) (LRB): MICROLARYNGOSCOPY WITH BIOPSY (N/A)     Patient location during evaluation: PACU Anesthesia Type: General Level of consciousness: sedated Pain management: pain level controlled Vital Signs Assessment: post-procedure vital signs reviewed and stable Respiratory status: spontaneous breathing and respiratory function stable Cardiovascular status: stable Anesthetic complications: no    Last Vitals:  Vitals:   03/03/17 1230 03/03/17 1245  BP: 116/73 116/64  Pulse: 74 65  Resp: 20 19  Temp:      Last Pain:  Vitals:   03/03/17 1215  TempSrc:   PainSc: Asleep                 Jeily Guthridge DANIEL

## 2017-03-04 ENCOUNTER — Encounter (HOSPITAL_BASED_OUTPATIENT_CLINIC_OR_DEPARTMENT_OTHER): Payer: Self-pay | Admitting: Otolaryngology

## 2017-03-09 ENCOUNTER — Ambulatory Visit (INDEPENDENT_AMBULATORY_CARE_PROVIDER_SITE_OTHER): Payer: Self-pay | Admitting: Otolaryngology

## 2017-03-09 DIAGNOSIS — C321 Malignant neoplasm of supraglottis: Secondary | ICD-10-CM

## 2017-03-11 ENCOUNTER — Encounter (HOSPITAL_COMMUNITY): Payer: Self-pay

## 2017-03-11 ENCOUNTER — Encounter (HOSPITAL_COMMUNITY): Payer: Self-pay | Attending: Oncology | Admitting: Oncology

## 2017-03-11 DIAGNOSIS — R634 Abnormal weight loss: Secondary | ICD-10-CM

## 2017-03-11 DIAGNOSIS — Z720 Tobacco use: Secondary | ICD-10-CM

## 2017-03-11 DIAGNOSIS — C321 Malignant neoplasm of supraglottis: Secondary | ICD-10-CM

## 2017-03-11 DIAGNOSIS — Z803 Family history of malignant neoplasm of breast: Secondary | ICD-10-CM

## 2017-03-11 DIAGNOSIS — C320 Malignant neoplasm of glottis: Secondary | ICD-10-CM | POA: Insufficient documentation

## 2017-03-11 DIAGNOSIS — Z808 Family history of malignant neoplasm of other organs or systems: Secondary | ICD-10-CM

## 2017-03-11 DIAGNOSIS — R490 Dysphonia: Secondary | ICD-10-CM

## 2017-03-12 ENCOUNTER — Encounter: Payer: Self-pay | Admitting: Oncology

## 2017-03-12 ENCOUNTER — Encounter (HOSPITAL_COMMUNITY): Payer: Self-pay | Admitting: Lab

## 2017-03-12 NOTE — Progress Notes (Signed)
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  HISTORY OF PRESENTING ILLNESS: Jamie Salinas 51 y.o. 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 Patient was referred by the free clinic at [facility]. Patient has been having progressive hoarseness for the past 2 years. She also has a persistent burning sensation in her throat which would radiate to her right ear. She states she's had unintentional 20 pound weight loss over the past few years. Patient has a history of more than 40 years of smoking 2-3 packs per day. She states she currently has cut down to one pack per day.   Patient was referred to Dr. Benjamine Mola and underwent a direct laryngoscopy on 03/03/17 which demonstrated a fungating mass was noted to erode and occupied the right aryepiglottic fold. The mass appeared to extend down to the right vocal cord. The left vocal cord was noted to be edematous. Biopsies were taken. Surgical path from the left vocal cord biopsy demonstrated reactive squamous mucosa, no malignancy identified. Soft tissue mass biopsy of right supraglottic mass demonstrated invasive squamous cell carcinoma.  Patient stated that she does not have any dysphagia. She states that she has been eating a lot since the time of her biopsy. She states that her hoarseness has worsened since the biopsy. She has a very poor dentition and was in the process of getting all her teeth pulled out however then she found out about the squamous cell cancer. She denies any chest pain, shortness breath, abdominal pain, focal weakness. 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  Review of Systems  Constitutional: Positive for unexpected weight change. Negative for appetite change and fatigue.  HENT:         Hoarseness  Eyes:  Negative.   Respiratory: Negative.   Cardiovascular: Negative.   Gastrointestinal: Negative.   Endocrine: Negative.   Genitourinary: Negative.    Musculoskeletal: Negative.   Skin: Negative.   Neurological: Negative.   Hematological: Negative.   Psychiatric/Behavioral: The patient is nervous/anxious.     MEDICAL HISTORY: Past Medical History:  Diagnosis Date  . Anxiety   . Asthma   . Complication of anesthesia   . COPD (chronic obstructive pulmonary disease) ([place])    emphasema  . Depression   . GERD (gastroesophageal reflux disease)   . Hypertension   . PONV (postoperative nausea and vomiting)     SURGICAL HISTORY: Past Surgical History:  Procedure Laterality Date  . CESAREAN SECTION  1992  . MICROLARYNGOSCOPY N/A 03/03/2017   Procedure: MICROLARYNGOSCOPY WITH BIOPSY;  Surgeon: Leta Baptist, MD;  Location: [place];  Service: ENT;  Laterality: N/A;    SOCIAL HISTORY: Social History   Social History  . Marital status: Married    Spouse name: N/A  . Number of children: N/A  . Years of education: N/A   Occupational History  . Not on file.   Social History Main Topics  . Smoking status: Current Every Day Smoker    Packs/day: 1.00    Years: 36.00    Types: Cigarettes  . Smokeless tobacco: Never Used  . Alcohol use Yes     Comment: 1-2 beers ocassionally  . Drug use: Yes    Types: Marijuana     Comment: "every once in a while"  . Sexual activity: Yes  Birth control/ protection: None   Other Topics Concern  . Not on file   Social History Narrative  . No narrative on file    FAMILY HISTORY Family History  Problem Relation Age of Onset  . Cancer Mother        Throat Cancer, Breast Cancer    ALLERGIES:  is allergic to amoxicillin; codeine; penicillins; and adhesive [tape].  MEDICATIONS:  Current Outpatient Prescriptions  Medication Sig Dispense Refill  . albuterol (PROVENTIL HFA;VENTOLIN HFA) 108 (90 Base) MCG/ACT inhaler Inhale 2  puffs into the lungs every 6 (six) hours as needed for wheezing or shortness of breath. 3 Inhaler 1  . albuterol (PROVENTIL) (2.5 MG/3ML) 0.083% nebulizer solution Take 3 mLs (2.5 mg total) by nebulization every 6 (six) hours as needed for wheezing or shortness of breath. 150 mL 1  . ALPRAZolam (XANAX PO) Take 1 tablet by mouth as needed.     Marland Kitchen lisinopril (PRINIVIL,ZESTRIL) 10 MG tablet Take 1 tablet (10 mg total) by mouth daily. (Patient taking differently: Take 5 mg by mouth daily. ) 30 tablet 1  . oxyCODONE-acetaminophen (ROXICET) 5-325 MG tablet Take 1 tablet by mouth every 6 (six) hours as needed for severe pain. 20 tablet 0  . varenicline (CHANTIX CONTINUING MONTH PAK) 1 MG tablet Take 1 tablet (1 mg total) by mouth 2 (two) times daily. (Patient not taking: Reported on 03/11/2017) 120 tablet 0  . varenicline (CHANTIX STARTING MONTH PAK) 0.5 MG X 11 & 1 MG X 42 tablet Take one 0.5 mg tablet PO once daily for 3 days, then increase to one 0.5 mg tablet bid for 4 days, then increase to one 1 mg tablet bid (Patient not taking: Reported on 03/11/2017) 53 tablet 0   No current facility-administered medications for this visit.     PHYSICAL EXAMINATION:  ECOG PERFORMANCE STATUS: 1 - Symptomatic but completely ambulatory   Vitals:   03/11/17 1340  BP: (!) 151/92  Pulse: 65  Resp: 18  Temp: 98.3 F (36.8 C)    Filed Weights   03/11/17 1340  Weight: 72 lb (32.7 kg)     Physical Exam  Constitutional: She is oriented to person, place, and time. No distress.  Cachectic female  HENT:  Very poor dentition with dental caries and multiple teeth missing  Eyes: Pupils are equal, round, and reactive to light. Conjunctivae are normal. No scleral icterus.  Neck: Normal range of motion. Neck supple.  Cardiovascular: Normal rate and regular rhythm.  Exam reveals no gallop and no friction rub.   No murmur heard. Pulmonary/Chest: Effort normal and breath sounds normal. No respiratory distress. She has  no wheezes.  Abdominal: Soft. Bowel sounds are normal. She exhibits no distension. There is no tenderness.  Musculoskeletal: Normal range of motion. She exhibits no edema or tenderness.  Lymphadenopathy:    Cervical adenopathy: left cervical LN.  Neurological: She is oriented to person, place, and time. No cranial nerve deficit.  Skin: Skin is warm and dry. No rash noted. No erythema. No pallor.  Psychiatric: Memory, affect and judgment normal.  Patient became tearful when she was talking about her diagnosis     LABORATORY DATA: I have personally reviewed the data as listed:  No visits with results within 1 Month(s) from this visit.  Latest known visit with results is:  Hospital Outpatient Visit on 01/21/2017  Component Date Value Ref Range Status  . Cholesterol 01/21/2017 171  0 - 200 mg/dL Final  . Triglycerides 01/21/2017 55  <  150 mg/dL Final  . HDL 01/21/2017 65  >40 mg/dL Final  . Total CHOL/HDL Ratio 01/21/2017 2.6  RATIO Final  . VLDL 01/21/2017 11  0 - 40 mg/dL Final  . LDL Cholesterol 01/21/2017 95  0 - 99 mg/dL Final   Comment:        Total Cholesterol/HDL:CHD Risk Coronary Heart Disease Risk Table                     Men   Women  1/2 Average Risk   3.4   3.3  Average Risk       5.0   4.4  2 X Average Risk   9.6   7.1  3 X Average Risk  23.4   11.0        Use the calculated Patient Ratio above and the CHD Risk Table to determine the patient's CHD Risk.        ATP III CLASSIFICATION (LDL):  <100     mg/dL   Optimal  100-129  mg/dL   Near or Above                    Optimal  130-159  mg/dL   Borderline  160-189  mg/dL   High  >190     mg/dL   Very High   . Sodium 01/21/2017 140  135 - 145 mmol/L Final  . Potassium 01/21/2017 3.9  3.5 - 5.1 mmol/L Final  . Chloride 01/21/2017 104  101 - 111 mmol/L Final  . CO2 01/21/2017 29  22 - 32 mmol/L Final  . Glucose, Bld 01/21/2017 104* 65 - 99 mg/dL Final  . BUN 01/21/2017 15  6 - 20 mg/dL Final  . Creatinine, Ser  01/21/2017 0.51  0.44 - 1.00 mg/dL Final  . Calcium 01/21/2017 9.3  8.9 - 10.3 mg/dL Final  . Total Protein 01/21/2017 7.2  6.5 - 8.1 g/dL Final  . Albumin 01/21/2017 4.1  3.5 - 5.0 g/dL Final  . AST 01/21/2017 35  15 - 41 U/L Final  . ALT 01/21/2017 22  14 - 54 U/L Final  . Alkaline Phosphatase 01/21/2017 51  38 - 126 U/L Final  . Total Bilirubin 01/21/2017 0.6  0.3 - 1.2 mg/dL Final  . GFR calc non Af Amer 01/21/2017 >60  >60 mL/min Final  . GFR calc Af Amer 01/21/2017 >60  >60 mL/min Final   Comment: (NOTE) The eGFR has been calculated using the CKD EPI equation. This calculation has not been validated in all clinical situations. eGFR's persistently <60 mL/min signify possible Chronic Kidney Disease.   . Anion gap 01/21/2017 7  5 - 15 Final  . WBC 01/21/2017 4.5  4.0 - 10.5 K/uL Final  . RBC 01/21/2017 4.21  3.87 - 5.11 MIL/uL Final  . Hemoglobin 01/21/2017 14.1  12.0 - 15.0 g/dL Final  . HCT 01/21/2017 41.7  36.0 - 46.0 % Final  . MCV 01/21/2017 99.0  78.0 - 100.0 fL Final  . MCH 01/21/2017 33.5  26.0 - 34.0 pg Final  . MCHC 01/21/2017 33.8  30.0 - 36.0 g/dL Final  . RDW 01/21/2017 13.3  11.5 - 15.5 % Final  . Platelets 01/21/2017 242  150 - 400 K/uL Final  . Neutrophils Relative % 01/21/2017 77  % Final  . Neutro Abs 01/21/2017 3.5  1.7 - 7.7 K/uL Final  . Lymphocytes Relative 01/21/2017 13  % Final  . Lymphs Abs 01/21/2017 0.6* 0.7 - 4.0 K/uL  Final  . Monocytes Relative 01/21/2017 9  % Final  . Monocytes Absolute 01/21/2017 0.4  0.1 - 1.0 K/uL Final  . Eosinophils Relative 01/21/2017 1  % Final  . Eosinophils Absolute 01/21/2017 0.0  0.0 - 0.7 K/uL Final  . Basophils Relative 01/21/2017 0  % Final  . Basophils Absolute 01/21/2017 0.0  0.0 - 0.1 K/uL Final    RADIOGRAPHIC STUDIES: I have personally reviewed the radiological images as listed and agree with the findings in the report  No results found.  ASSESSMENT/PLAN 51 year old female with squamous cell carcinoma of  the right supraglottic larynx, cT1cN1.  I have ordered a staging PET CT.  Based on her final staging with PET CT, we will see if she will need induction chemo vs proceeding with concurrent systemic chemo with RT. She will need to see radiation-oncology in [place] since she is currently uninsured for evaluation regarding radiation. I believe patient will have poor tolerability to chemotherapy. She is very cachectic and does not appear to have much reserve.  We will get social work involved to see if they can offer her any form of assistance since she does not have any transportation.  RTC in 2 weeks for follow up.  Orders Placed This Encounter  Procedures  . NM PET Image Initial (PI) Skull Base To Thigh    Standing Status:   Future    Standing Expiration Date:   03/11/2018    Order Specific Question:   Reason for Exam (SYMPTOM  OR DIAGNOSIS REQUIRED)    Answer:   staging scans for new vocal cord cancer    Order Specific Question:   If indicated for the ordered procedure, I authorize the administration of a radiopharmaceutical per Radiology protocol    Answer:   Yes    Order Specific Question:   Is the patient pregnant?    Answer:   No    Order Specific Question:   Preferred imaging location?    Answer:   [facility]    Order Specific Question:   Radiology Contrast Protocol - do NOT remove file path    Answer:   [email or web address]  . IR Fluoro Guide CV Line Left    stat    Standing Status:   Future    Standing Expiration Date:   05/11/2018    Order Specific Question:   Reason for Exam (SYMPTOM  OR DIAGNOSIS REQUIRED)    Answer:   chemoport    Order Specific Question:   Is the patient pregnant?    Answer:   No    Order Specific Question:   Preferred Imaging Location?    Answer:   [facility]  . Ambulatory referral to Social Work    Referral Priority:   Routine    Referral Type:   Consultation    Referral Reason:   Specialty Services  Required    Number of Visits Requested:   1    All questions were answered. The patient knows to call the clinic with any problems, questions or concerns.  This note was electronically signed.    Twana First, MD  03/12/2017 12:49 PM

## 2017-03-12 NOTE — Progress Notes (Unsigned)
Referral sent to [facility] because of transportation.  Records faxed on 8/2

## 2017-03-13 ENCOUNTER — Ambulatory Visit
Admission: RE | Admit: 2017-03-13 | Discharge: 2017-03-13 | Disposition: A | Discharge status: Home / Self Care | Payer: Medicaid Other | Source: Ambulatory Visit | Attending: Oncology | Admitting: Oncology

## 2017-03-13 DIAGNOSIS — C320 Malignant neoplasm of glottis: Secondary | ICD-10-CM | POA: Diagnosis present

## 2017-03-13 DIAGNOSIS — J439 Emphysema, unspecified: Secondary | ICD-10-CM | POA: Diagnosis not present

## 2017-03-13 LAB — GLUCOSE, CAPILLARY: Glucose-Capillary: 89 mg/dL (ref 65–99)

## 2017-03-13 MED ORDER — FLUDEOXYGLUCOSE F - 18 (FDG) INJECTION
12.9600 | Freq: Once | INTRAVENOUS | Status: AC | PRN
  Administered 2017-03-13: 12.96 via INTRAVENOUS

## 2017-03-16 ENCOUNTER — Other Ambulatory Visit: Payer: Self-pay | Admitting: Radiology

## 2017-03-16 ENCOUNTER — Ambulatory Visit: Admission: RE | Admit: 2017-03-16 | Payer: Self-pay | Source: Ambulatory Visit

## 2017-03-16 ENCOUNTER — Ambulatory Visit
Admission: RE | Admit: 2017-03-16 | Discharge: 2017-03-16 | Disposition: A | Discharge status: Home / Self Care | Payer: Self-pay | Source: Ambulatory Visit | Attending: Radiation Oncology | Admitting: Radiation Oncology

## 2017-03-17 ENCOUNTER — Encounter: Payer: Self-pay | Admitting: *Deleted

## 2017-03-17 ENCOUNTER — Other Ambulatory Visit (HOSPITAL_COMMUNITY): Payer: Self-pay | Admitting: Oncology

## 2017-03-17 ENCOUNTER — Ambulatory Visit (HOSPITAL_COMMUNITY)
Admission: RE | Admit: 2017-03-17 | Discharge: 2017-03-17 | Disposition: A | Discharge status: Home / Self Care | Payer: Medicaid Other | Source: Ambulatory Visit | Attending: Oncology | Admitting: Oncology

## 2017-03-17 ENCOUNTER — Encounter (HOSPITAL_COMMUNITY): Payer: Self-pay | Admitting: Interventional Radiology

## 2017-03-17 DIAGNOSIS — K219 Gastro-esophageal reflux disease without esophagitis: Secondary | ICD-10-CM | POA: Diagnosis not present

## 2017-03-17 DIAGNOSIS — F419 Anxiety disorder, unspecified: Secondary | ICD-10-CM | POA: Insufficient documentation

## 2017-03-17 DIAGNOSIS — C320 Malignant neoplasm of glottis: Secondary | ICD-10-CM

## 2017-03-17 DIAGNOSIS — J449 Chronic obstructive pulmonary disease, unspecified: Secondary | ICD-10-CM | POA: Diagnosis not present

## 2017-03-17 DIAGNOSIS — R64 Cachexia: Secondary | ICD-10-CM | POA: Insufficient documentation

## 2017-03-17 DIAGNOSIS — I10 Essential (primary) hypertension: Secondary | ICD-10-CM | POA: Diagnosis not present

## 2017-03-17 DIAGNOSIS — F329 Major depressive disorder, single episode, unspecified: Secondary | ICD-10-CM | POA: Insufficient documentation

## 2017-03-17 DIAGNOSIS — Z880 Allergy status to penicillin: Secondary | ICD-10-CM | POA: Insufficient documentation

## 2017-03-17 DIAGNOSIS — F17210 Nicotine dependence, cigarettes, uncomplicated: Secondary | ICD-10-CM | POA: Diagnosis not present

## 2017-03-17 DIAGNOSIS — C329 Malignant neoplasm of larynx, unspecified: Secondary | ICD-10-CM | POA: Diagnosis not present

## 2017-03-17 DIAGNOSIS — Z681 Body mass index (BMI) 19 or less, adult: Secondary | ICD-10-CM | POA: Diagnosis not present

## 2017-03-17 DIAGNOSIS — I700 Atherosclerosis of aorta: Secondary | ICD-10-CM | POA: Insufficient documentation

## 2017-03-17 HISTORY — PX: IR FLUORO GUIDE CV LINE RIGHT: IMG2283

## 2017-03-17 HISTORY — PX: IR US GUIDE VASC ACCESS RIGHT: IMG2390

## 2017-03-17 LAB — BASIC METABOLIC PANEL
ANION GAP: 6 (ref 5–15)
BUN: 10 mg/dL (ref 6–20)
CALCIUM: 9.1 mg/dL (ref 8.9–10.3)
CHLORIDE: 108 mmol/L (ref 101–111)
CO2: 28 mmol/L (ref 22–32)
Creatinine, Ser: 0.47 mg/dL (ref 0.44–1.00)
GFR calc non Af Amer: >60 mL/min (ref >60)
Glucose, Bld: 90 mg/dL (ref 65–99)
Potassium: 4.2 mmol/L (ref 3.5–5.1)
Sodium: 142 mmol/L (ref 135–145)

## 2017-03-17 LAB — CBC
HCT: 37.9 % (ref 36.0–46.0)
Hemoglobin: 12.7 g/dL (ref 12.0–15.0)
MCH: 33.1 pg (ref 26.0–34.0)
MCHC: 33.5 g/dL (ref 30.0–36.0)
MCV: 98.7 fL (ref 78.0–100.0)
Platelets: 249 10*3/uL (ref 150–400)
RBC: 3.84 MIL/uL — AB (ref 3.87–5.11)
RDW: 12.4 % (ref 11.5–15.5)
WBC: 4.2 10*3/uL (ref 4.0–10.5)

## 2017-03-17 LAB — PROTIME-INR
INR: 0.9
Prothrombin Time: 12.1 seconds (ref 11.4–15.2)

## 2017-03-17 LAB — APTT: aPTT: 31 seconds (ref 24–36)

## 2017-03-17 MED ORDER — LIDOCAINE HCL 1 % IJ SOLN
INTRAMUSCULAR | Status: DC | PRN
  Administered 2017-03-17: 5 mL

## 2017-03-17 MED ORDER — HEPARIN SOD (PORK) LOCK FLUSH 100 UNIT/ML IV SOLN
INTRAVENOUS | Status: AC
  Filled 2017-03-17: qty 5

## 2017-03-17 MED ORDER — SODIUM CHLORIDE 0.9 % IV SOLN: INTRAVENOUS | Status: DC

## 2017-03-17 MED ORDER — LIDOCAINE HCL (PF) 1 % IJ SOLN
INTRAMUSCULAR | Status: AC
  Filled 2017-03-17: qty 30

## 2017-03-17 MED ORDER — VANCOMYCIN HCL IN DEXTROSE 1-5 GM/200ML-% IV SOLN: 1000.0000 mg | INTRAVENOUS | Status: DC

## 2017-03-17 MED ORDER — VANCOMYCIN HCL IN DEXTROSE 1-5 GM/200ML-% IV SOLN: 1000.0000 mg | Freq: Once | INTRAVENOUS | Status: DC

## 2017-03-17 NOTE — Progress Notes (Signed)
[facility] Psychosocial Distress Screening Clinical Social Work  Clinical Social Work was referred by distress screening protocol.  The patient scored a 10 on the Psychosocial Distress Thermometer which indicates severe distress. Clinical Social Worker reviewed chart and spoke to the medical team to assess for distress and other psychosocial needs. Pt has been referred to [facility] in [place] in order to have better access to care due to transportation concerns. Pt will be starting care at their site very soon.   ONCBCN DISTRESS SCREENING 03/11/2017  Screening Type Initial Screening  Distress experienced in past week (1-10) 10  Practical problem type Housing;Insurance;Work/school;Transportation;Food  Emotional problem type Depression;Nervousness/Anxiety;Adjusting to illness;Isolation/feeling alone;Feeling hopeless;Boredom;Adjusting to appearance changes  Spiritual/Religous concerns type Facing my mortality  Information Concerns Type Lack of info about diagnosis;Lack of info about treatment  Physical Problem type Pain    Clinical Social Worker follow up needed: No.  If yes, follow up plan: Loren Racer, [place], OSW-C [place] Tuesdays   [phone]

## 2017-03-17 NOTE — H&P (Signed)
Chief Complaint: Patient was seen in consultation today for laryngeal cancer  Referring Physician(s): Zhou,Louise  Supervising Physician: Sandi Mariscal  Patient Status: [facility] - Out-pt  History of Present Illness: Jamie Salinas is a 51 y.o. female with past medical history of anxiety, asthma, COPD, depression, and HTN presented to her PCP for progressive hoarseness.  She underwent direct laryngoscopy on 03/03/17 which showed a fungating mass of the right epiglottic fold extending to the vocal cord.  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 Patient is to initiate chemotherapy regimen.  IR consulted for Port-A-Cath placement at the request of Dr. Talbert Cage.   Patient presents today in her usual state of health.  She denies new complaints.  She has been NPO.  She does not take blood thinners.   Past Medical History:  Diagnosis Date  . Anxiety   . Asthma   . Complication of anesthesia   . COPD (chronic obstructive pulmonary disease) ([place])    emphasema  . Depression   . GERD (gastroesophageal reflux disease)   . Hypertension   . PONV (postoperative nausea and vomiting)     Past Surgical History:  Procedure Laterality Date  . CESAREAN SECTION  1992  . MICROLARYNGOSCOPY N/A 03/03/2017   Procedure: MICROLARYNGOSCOPY WITH BIOPSY;  Surgeon: Leta Baptist, MD;  Location: [facility];  Service: ENT;  Laterality: N/A;    Allergies: Amoxicillin; Codeine; Penicillins; and Adhesive [tape]  Medications: Prior to Admission medications   Medication Sig Start Date End Date Taking? Authorizing Provider  albuterol (PROVENTIL HFA;VENTOLIN HFA) 108 (90 Base) MCG/ACT inhaler Inhale 2 puffs into the lungs every 6 (six) hours as needed for wheezing or shortness of breath. 01/19/17  Yes Soyla Dryer, PA-C  albuterol (PROVENTIL) (2.5 MG/3ML) 0.083% nebulizer solution Take 3 mLs (2.5 mg total) by nebulization every 6 (six) hours as needed for wheezing or  shortness of breath. 02/17/17  Yes Soyla Dryer, PA-C  ALPRAZolam Duanne Moron) 1 MG tablet Take 0.5 tablets by mouth at bedtime.    Yes [provider]  lactose free nutrition (BOOST) LIQD Take 237 mLs by mouth 3 (three) times daily between meals.   Yes [provider]  lisinopril (PRINIVIL,ZESTRIL) 10 MG tablet Take 1 tablet (10 mg total) by mouth daily. 01/19/17  Yes Soyla Dryer, PA-C  oxyCODONE-acetaminophen (ROXICET) 5-325 MG tablet Take 1 tablet by mouth every 6 (six) hours as needed for severe pain. 03/03/17  Yes Leta Baptist, MD  varenicline (CHANTIX CONTINUING MONTH PAK) 1 MG tablet Take 1 tablet (1 mg total) by mouth 2 (two) times daily. Patient not taking: Reported on 03/11/2017 02/17/17   Soyla Dryer, PA-C  varenicline (CHANTIX STARTING MONTH PAK) 0.5 MG X 11 & 1 MG X 42 tablet Take one 0.5 mg tablet PO once daily for 3 days, then increase to one 0.5 mg tablet bid for 4 days, then increase to one 1 mg tablet bid Patient not taking: Reported on 03/11/2017 02/17/17   Soyla Dryer, PA-C     Family History  Problem Relation Age of Onset  . Cancer Mother        Throat Cancer, Breast Cancer    Social History   Social History  . Marital status: Married    Spouse name: N/A  . Number of children: N/A  . Years of education: N/A   Social History Main Topics  . Smoking status: Current Every Day Smoker    Packs/day: 1.00    Years: 36.00  Types: Cigarettes  . Smokeless tobacco: Never Used  . Alcohol use Yes     Comment: 1-2 beers ocassionally  . Drug use: Yes    Types: Marijuana     Comment: "every once in a while"  . Sexual activity: Yes    Birth control/ protection: None   Other Topics Concern  . Not on file   Social History Narrative  . No narrative on file    Review of Systems  Constitutional: Negative for fatigue and fever.  Respiratory: Negative for cough and shortness of breath.   Cardiovascular: Negative for chest pain.    Psychiatric/Behavioral: Negative for behavioral problems and confusion.    Vital Signs: BP (!) 146/89   Pulse 74   Temp 97.7 F (36.5 C)   Resp 20   Ht 4\' 11"  (1.499 m)   Wt 72 lb (32.7 kg)   LMP 03/09/2012   SpO2 99%   BMI 14.54 kg/m   Physical Exam  Constitutional: She is oriented to person, place, and time. She appears well-developed.  Severe subcutaneous fat and muscle wasting.   Cardiovascular: Normal rate, regular rhythm and normal heart sounds.   Pulmonary/Chest: Effort normal and breath sounds normal. No respiratory distress.  Neurological: She is alert and oriented to person, place, and time.  Skin: Skin is warm and dry.  Psychiatric: She has a normal mood and affect. Her behavior is normal. Judgment and thought content normal.  Nursing note and vitals reviewed.   Mallampati Score:  MD Evaluation Airway: WNL Heart: WNL Abdomen: WNL Chest/ Lungs: WNL ASA  Classification: 3 Mallampati/Airway Score: Two  Imaging: Nm Pet Image Initial (pi) Skull Base To Thigh  Result Date: 03/13/2017 CLINICAL DATA:  Initial treatment strategy for recent diagnosis of vocal cord tumor. Biopsy of right area epiglottic fold. EXAM: NUCLEAR MEDICINE PET SKULL BASE TO THIGH TECHNIQUE: 12.96 mCi F-18 FDG was injected intravenously. Full-ring PET imaging was performed from the skull base to thigh after the radiotracer. CT data was obtained and used for attenuation correction and anatomic localization. FASTING BLOOD GLUCOSE:  Value: 89 mg/dl COMPARISON:  None. FINDINGS: NECK There is a hypermetabolic cervical lymph node on the left inferior to the hyoid bone which measures approximately 8 mm short axis on image 44. This has an SUV max of 8.6. There is extensive hypermetabolic tumor involving the right side of the glottis and aryepiglottic fold. There is anterior extension across the midline.No other lesions of the pharyngeal mucosal space are seen. There is no other hypermetabolic cervical  adenopathy. CHEST There are no hypermetabolic mediastinal, hilar or axillary lymph nodes. No suspicious pulmonary activity. Lung windows demonstrate moderate centrilobular and paraseptal emphysema. There are no suspicious pulmonary nodules. There is atherosclerosis of the aorta, great vessels and coronary arteries. ABDOMEN/PELVIS There is no hypermetabolic activity within the liver, adrenal glands, spleen or pancreas. There is no hypermetabolic nodal activity. Mild aortoiliac atherosclerosis noted. SKELETON There is no hypermetabolic activity to suggest osseous metastatic disease. IMPRESSION: 1. Hypermetabolic right glottic malignancy with single hypermetabolic level 3 node on the left. 2. No distant metastases. 3. No evidence of incidental neoplasm in the chest, abdomen or pelvis. 4. Moderate centrilobular and paraseptal emphysema. Electronically Signed   By: Richardean Sale M.D.   On: 03/13/2017 11:44    Labs:  CBC:  Recent Labs  01/21/17 0919  WBC 4.5  HGB 14.1  HCT 41.7  PLT 242    COAGS: No results for input(s): INR, APTT in the last 8760 hours.  BMP:  Recent Labs  01/21/17 0919  NA 140  K 3.9  CL 104  CO2 29  GLUCOSE 104*  BUN 15  CALCIUM 9.3  CREATININE 0.51  GFRNONAA >60  GFRAA >60    LIVER FUNCTION TESTS:  Recent Labs  01/21/17 0919  BILITOT 0.6  AST 35  ALT 22  ALKPHOS 51  PROT 7.2  ALBUMIN 4.1    TUMOR MARKERS: No results for input(s): AFPTM, CEA, CA199, CHROMGRNA in the last 8760 hours.  Assessment and Plan: Patient with past medical history of hoarseness presents with recently diagnosed squamous cell right supraglottic laryngeal cancer.  She has plans for upcoming chemotherapy. IR consulted for Port-A-Cath placement at the request of Dr. Talbert Cage. Patient presents today in their usual state of health.  She has been NPO and is not currently on blood thinners.  Risks and benefits discussed with the patient including, but not limited to bleeding,  infection, pneumothorax, or fibrin sheath development and need for additional procedures. All of the patient's questions were answered, patient is agreeable to proceed. Consent signed and in chart.  Thank you for this interesting consult.  I greatly enjoyed meeting SARGUN RUMMELL and look forward to participating in their care.  A copy of this report was sent to the requesting provider on this date.  Electronically Signed: Docia Barrier, PA 03/17/2017, 9:48 AM   I spent a total of  30 Minutes   in face to face in clinical consultation, greater than 50% of which was counseling/coordinating care for laryngeal cancer.

## 2017-03-17 NOTE — Procedures (Signed)
Pre procedural Diagnosis: Poor venous access Post Procedural Diagnosis: Same  Successful placement of right brachial vein approach 30 cm dual lumen PICC line with tip at the superior caval-atrial junction.    EBL: None  No immediate post procedural complication.  The PICC line is ready for immediate use.  Ronny Bacon, MD Pager #: [phone]

## 2017-03-18 ENCOUNTER — Ambulatory Visit (HOSPITAL_COMMUNITY): Payer: Self-pay

## 2017-03-18 DIAGNOSIS — C321 Malignant neoplasm of supraglottis: Secondary | ICD-10-CM | POA: Insufficient documentation

## 2017-03-23 ENCOUNTER — Encounter (HOSPITAL_COMMUNITY): Payer: Self-pay | Admitting: Dentistry

## 2017-03-23 ENCOUNTER — Ambulatory Visit (HOSPITAL_COMMUNITY): Payer: Self-pay | Admitting: Dentistry

## 2017-03-23 VITALS — BP 120/60 | HR 60 | Temp 98.1°F

## 2017-03-23 DIAGNOSIS — Z01818 Encounter for other preprocedural examination: Secondary | ICD-10-CM

## 2017-03-23 DIAGNOSIS — K036 Deposits [accretions] on teeth: Secondary | ICD-10-CM

## 2017-03-23 DIAGNOSIS — K06010 Localized gingival recession, unspecified: Secondary | ICD-10-CM

## 2017-03-23 DIAGNOSIS — M264 Malocclusion, unspecified: Secondary | ICD-10-CM

## 2017-03-23 DIAGNOSIS — K0889 Other specified disorders of teeth and supporting structures: Secondary | ICD-10-CM | POA: Insufficient documentation

## 2017-03-23 DIAGNOSIS — K029 Dental caries, unspecified: Secondary | ICD-10-CM

## 2017-03-23 DIAGNOSIS — K0530 Chronic periodontitis, unspecified: Secondary | ICD-10-CM | POA: Insufficient documentation

## 2017-03-23 DIAGNOSIS — C321 Malignant neoplasm of supraglottis: Secondary | ICD-10-CM

## 2017-03-23 DIAGNOSIS — K08409 Partial loss of teeth, unspecified cause, unspecified class: Secondary | ICD-10-CM

## 2017-03-23 DIAGNOSIS — K06020 Generalized gingival recession, unspecified: Secondary | ICD-10-CM

## 2017-03-23 DIAGNOSIS — K045 Chronic apical periodontitis: Secondary | ICD-10-CM

## 2017-03-23 NOTE — Patient Instructions (Signed)

## 2017-03-23 NOTE — Progress Notes (Signed)
DENTAL CONSULTATION  Date of Consultation:  03/23/2017 Patient Name:   Jamie Salinas Date of Birth:   11-10-1965 Medical Record Number: [id number]  VITALS: BP 120/60 (BP Location: Left Arm)   Pulse 60   Temp 98.1 F (36.7 C) (Oral)   LMP 03/09/2012   CHIEF COMPLAINT: Patient referred by Dr. Quitman Livings for dental consultation.  HPI: Jamie Salinas is a 51 year old female recently diagnosed with squamous cell carcinoma of the supraglottic larynx. Patient with anticipated chemoradiation therapy. Patient now seen as part of a medically necessary pre-chemoradiation therapy dental protocol examination.  The patient currently denies acute toothaches, swellings, or abscesses. Patient was last seen by a dentist in [place], [place] for several extractions in April or May 2018.  This was with Dr. Joycelyn Schmid.   Patient denies complications from those dental extractions.  Patient was planning to have all remaining teeth extracted over an extended period of time to allow for payment of these extraction procedures by report. Patient denies having any partial dentures at this time. Patient may have some dental phobia along with her anxiety and depression.  PROBLEM LIST: Patient Active Problem List   Diagnosis Date Noted  . Squamous cell carcinoma of vocal cord ([place]) 03/11/2017    Priority: High  . Essential hypertension 02/17/2017  . Chronic obstructive pulmonary disease ([place]) 02/17/2017  . Cigarette nicotine dependence with nicotine-induced disorder 02/17/2017  . Loss of weight 05/15/2014  . Depression 05/14/2014  . MDD (major depressive disorder), severe ([place]) 05/14/2014    PMH: Past Medical History:  Diagnosis Date  . Anxiety   . Asthma   . Complication of anesthesia   . COPD (chronic obstructive pulmonary disease) ([place])    emphasema  . Depression   . GERD (gastroesophageal reflux disease)   . Hypertension   . PONV (postoperative nausea and vomiting)     PSH: Past Surgical  History:  Procedure Laterality Date  . CESAREAN SECTION  1992  . IR FLUORO GUIDE CV LINE RIGHT  03/17/2017  . IR US GUIDE VASC ACCESS RIGHT  03/17/2017  . MICROLARYNGOSCOPY N/A 03/03/2017   Procedure: MICROLARYNGOSCOPY WITH BIOPSY;  Surgeon: Leta Baptist, MD;  Location: [facility];  Service: ENT;  Laterality: N/A;    ALLERGIES: Allergies  Allergen Reactions  . Amoxicillin Shortness Of Breath, Nausea And Vomiting and Rash  . Codeine Nausea And Vomiting  . Penicillins Nausea And Vomiting  . Adhesive [Tape] Rash    MEDICATIONS: Current Outpatient Prescriptions  Medication Sig Dispense Refill  . albuterol (PROVENTIL HFA;VENTOLIN HFA) 108 (90 Base) MCG/ACT inhaler Inhale 2 puffs into the lungs every 6 (six) hours as needed for wheezing or shortness of breath. 3 Inhaler 1  . albuterol (PROVENTIL) (2.5 MG/3ML) 0.083% nebulizer solution Take 3 mLs (2.5 mg total) by nebulization every 6 (six) hours as needed for wheezing or shortness of breath. 150 mL 1  . ALPRAZolam (XANAX) 1 MG tablet Take 0.5 tablets by mouth at bedtime.     . lactose free nutrition (BOOST) LIQD Take 237 mLs by mouth 3 (three) times daily between meals.    Marland Kitchen lisinopril (PRINIVIL,ZESTRIL) 10 MG tablet Take 1 tablet (10 mg total) by mouth daily. 30 tablet 1  . oxyCODONE-acetaminophen (ROXICET) 5-325 MG tablet Take 1 tablet by mouth every 6 (six) hours as needed for severe pain. 20 tablet 0  . varenicline (CHANTIX CONTINUING MONTH PAK) 1 MG tablet Take 1 tablet (1 mg total) by mouth 2 (two) times daily. (Patient not  taking: Reported on 03/11/2017) 120 tablet 0  . varenicline (CHANTIX STARTING MONTH PAK) 0.5 MG X 11 & 1 MG X 42 tablet Take one 0.5 mg tablet PO once daily for 3 days, then increase to one 0.5 mg tablet bid for 4 days, then increase to one 1 mg tablet bid (Patient not taking: Reported on 03/11/2017) 53 tablet 0   No current facility-administered medications for this visit.     LABS: Lab Results  Component  Value Date   WBC 4.2 03/17/2017   HGB 12.7 03/17/2017   HCT 37.9 03/17/2017   MCV 98.7 03/17/2017   PLT 249 03/17/2017      Component Value Date/Time   NA 142 03/17/2017 0933   K 4.2 03/17/2017 0933   CL 108 03/17/2017 0933   CO2 28 03/17/2017 0933   GLUCOSE 90 03/17/2017 0933   BUN 10 03/17/2017 0933   CREATININE 0.47 03/17/2017 0933   CALCIUM 9.1 03/17/2017 0933   GFRNONAA >60 03/17/2017 0933   GFRAA >60 03/17/2017 0933   Lab Results  Component Value Date   INR 0.90 03/17/2017   INR 1.0 05/18/2008   No results found for: PTT  SOCIAL HISTORY: Social History   Social History  . Marital status: Married    Spouse name: N/A  . Number of children: N/A  . Years of education: N/A   Occupational History  . Not on file.   Social History Main Topics  . Smoking status: Current Every Day Smoker    Packs/day: 1.00    Years: 36.00    Types: Cigarettes  . Smokeless tobacco: Never Used  . Alcohol use Yes     Comment: 1-2 beers ocassionally  . Drug use: Yes    Types: Marijuana     Comment: "every once in a while"  . Sexual activity: Yes    Birth control/ protection: None   Other Topics Concern  . Not on file   Social History Narrative  . No narrative on file    FAMILY HISTORY: Family History  Problem Relation Age of Onset  . Cancer Mother        Throat Cancer, Breast Cancer    REVIEW OF SYSTEMS: Reviewed with the patient as per History of present illness. Psych: Patient Does have some dental phobia along with anxiety and depression.   DENTAL HISTORY: CHIEF COMPLAINT: Patient referred by Dr. Quitman Livings for dental consultation.  HPI: Jamie Salinas is a 51 year old female recently diagnosed with squamous cell carcinoma of the supraglottic larynx. Patient with anticipated chemoradiation therapy. Patient now seen as part of a medically necessary pre-chemoradiation therapy dental protocol examination.  The patient currently denies acute toothaches, swellings,  or abscesses. Patient was last seen by a dentist in [place], [place] for several extractions in April or May 2018.  This was with Dr. Tamala Julian.   Patient denies complications from those dental extractions.  Patient was planning to have all remaining teeth extracted over an extended period of time to allow for payment of these extraction procedures by report. Patient denies having any partial dentures at this time. Patient may have some dental phobia along with her anxiety and depression.   DENTAL EXAMINATION: GENERAL: The patient is a well-developed, slightly built female in no acute distress. HEAD AND NECK: There is left neck lymphadenopathy palpated today. Patient denies having acute TMJ symptoms.  INTRAORAL EXAM: Patient has normal saliva. There is traumatic irritation on the lower right alveolar ridge due to supra-eruption of tooth #2.  DENTITION: Patient is missing tooth numbers 1, 3, [id number], and 30-32. PERIODONTAL: The patient has chronic periodontitis with plaque and calculus accumulations, generalized gingival recession, generalized tooth mobility. There is moderate to severe bone loss noted. DENTAL CARIES/SUBOPTIMAL RESTORATIONS: Multiple dental caries are noted as per dental charting form. ENDODONTIC: The patient denies acute pulpitis symptoms. Multiple areas of periapical pathology are noted. CROWN AND BRIDGE: There are no crown or bridge restorations. PROSTHODONTIC: Patient denies having partial dentures at this time. OCCLUSION: Patient has a poor occlusal scheme secondary to multiple missing teeth, supra-eruption and drifting of the unopposed teeth into the edentulous areas, and lack of replacement of missing teeth with dental prostheses.  RADIOGRAPHIC INTERPRETATION: Orthopantogram was taken today and supplemented with a full series of 10 periapical radiographs. There are multiple missing teeth. There are multiple dental caries noted. There is moderate to severe bone loss. There  multiple areas of periapical pathology and radiolucency. There is supra-eruption and drifting of the unopposed teeth into the edentulous areas.   ASSESSMENTS: 1. Squamous cell carcinoma of the supraglottic larynx 2. Pre-chemoradiation therapy dental protocol 3. Chronic apical periodontitis 4. Dental caries 5. Chronic periodontitis with bone loss 6. Gingival recession 7. Tooth mobility 8. Accretions 9. Multiple missing teeth 10. Supra-eruption and drifting of the unopposed teeth into the edentulous areas 11. Poor occlusal scheme and malocclusion 12. Traumatic irritation to the soft tissues in the area of #31 due to supra-eruption of tooth #2 13. Risk for complications up to and including death with anticipated invasive dental procedures the operating with general anesthesia secondary to the patient's medical and respiratory compromise.  PLAN/RECOMMENDATIONS: 1. I discussed the risks, benefits, and complications of various treatment options with the patient in relationship to her medical and dental conditions, anticipated chemoradiation therapy, and accumulations therapy side effects to include xerostomia, radiation caries, trismus, mucositis, taste changes, gum and jawbone changes, and risk for infection and osteoradionecrosis. We discussed various treatment options to include no treatment, multiple extractions with alveoloplasty, pre-prosthetic surgery as indicated, periodontal therapy, dental restorations, root canal therapy, crown and bridge therapy, implant therapy, and replacement of missing teeth as indicated. The patient currently wishes to proceed with extraction of remaining teeth with alveoloplasty in the operating room with general anesthesia. This has been scheduled for Thursday, 03/26/2017 at 7:30 AM at [facility]. The patient will then proceed with simulation with radiation oncology approximately 5 days later and chemoradiation therapy to start no earlier than 04/10/2017.  Patient will then follow the dentist of her choice for fabrication of upper and lower complete dentures approximately 3 months after the last radiation therapy has been provided.  2. Discussion of findings with medical team and coordination of future medical and dental care as needed.  I spent in excess of 120 minutes during the conduct of this consultation and >50% of this time involved direct face-to-face encounter for counseling and/or coordination of the patient's care.    Lenn Cal, DDS

## 2017-03-24 ENCOUNTER — Encounter: Payer: Self-pay | Admitting: Physician Assistant

## 2017-03-24 ENCOUNTER — Ambulatory Visit: Payer: Self-pay | Admitting: Physician Assistant

## 2017-03-24 ENCOUNTER — Other Ambulatory Visit (HOSPITAL_COMMUNITY): Payer: Self-pay | Admitting: *Deleted

## 2017-03-24 ENCOUNTER — Encounter (HOSPITAL_COMMUNITY): Payer: Self-pay | Admitting: *Deleted

## 2017-03-24 ENCOUNTER — Ambulatory Visit (HOSPITAL_COMMUNITY): Payer: Self-pay

## 2017-03-24 VITALS — BP 100/60 | HR 89 | Temp 98.1°F | Ht 59.0 in | Wt 72.8 lb

## 2017-03-24 DIAGNOSIS — F32A Depression, unspecified: Secondary | ICD-10-CM

## 2017-03-24 DIAGNOSIS — R636 Underweight: Secondary | ICD-10-CM

## 2017-03-24 DIAGNOSIS — F17219 Nicotine dependence, cigarettes, with unspecified nicotine-induced disorders: Secondary | ICD-10-CM

## 2017-03-24 DIAGNOSIS — C320 Malignant neoplasm of glottis: Secondary | ICD-10-CM

## 2017-03-24 DIAGNOSIS — J449 Chronic obstructive pulmonary disease, unspecified: Secondary | ICD-10-CM

## 2017-03-24 DIAGNOSIS — I10 Essential (primary) hypertension: Secondary | ICD-10-CM

## 2017-03-24 DIAGNOSIS — F329 Major depressive disorder, single episode, unspecified: Secondary | ICD-10-CM

## 2017-03-24 NOTE — Progress Notes (Signed)
BP 100/60 (BP Location: Left Arm, Patient Position: Sitting, Cuff Size: Normal)   Pulse 89   Temp 98.1 F (36.7 C)   Ht 4\' 11"  (1.499 m)   Wt 72 lb 12 oz (33 kg)   LMP 03/09/2012   SpO2 96%   BMI 14.69 kg/m    Subjective:    Patient ID: Jamie Salinas, female    DOB: 1966-07-29, 51 y.o.   MRN: [id number]  HPI: Jamie Salinas is a 51 y.o. female presenting on 03/24/2017 for Hypertension and COPD   HPI   Pt's friend is with her today  Pt says chantix made her sick so she stopped taking it.  She is still smoking a little bit.    She says she usually only smokes a handful.  She says at one point in her life she was smoking 3 or 4 ppd.    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 She says she has lawyers calling her all the time and even knocking on her door.     Pt says she has appt this afternoon to discuss feeding tube placement with the surgeon and is having the remainder of her teeth pulled on thursday  Relevant past medical, surgical, family and social history reviewed and updated as indicated. Interim medical history since our last visit reviewed. Allergies and medications reviewed and updated.   Current Outpatient Prescriptions:  .  albuterol (PROVENTIL HFA;VENTOLIN HFA) 108 (90 Base) MCG/ACT inhaler, Inhale 2 puffs into the lungs every 6 (six) hours as needed for wheezing or shortness of breath. (Patient taking differently: Inhale 2 puffs into the lungs every 4 (four) hours as needed for wheezing or shortness of breath. ), Disp: 3 Inhaler, Rfl: 1 .  albuterol (PROVENTIL) (2.5 MG/3ML) 0.083% nebulizer solution, Take 3 mLs (2.5 mg total) by nebulization every 6 (six) hours as needed for wheezing or shortness of breath., Disp: 150 mL, Rfl: 1 .  lactose free nutrition (BOOST) LIQD, Take 237 mLs by mouth 3 (three) times daily  between meals., Disp: , Rfl:  .  lisinopril (PRINIVIL,ZESTRIL) 10 MG tablet, Take 1 tablet (10 mg total) by mouth daily., Disp: 30 tablet, Rfl: 1 .  ALPRAZolam (XANAX) 1 MG tablet, Take 0.5 mg by mouth at bedtime. , Disp: , Rfl:  .  oxyCODONE-acetaminophen (ROXICET) 5-325 MG tablet, Take 1 tablet by mouth every 6 (six) hours as needed for severe pain. (Patient not taking: Reported on 03/24/2017), Disp: 20 tablet, Rfl: 0 .  varenicline (CHANTIX CONTINUING MONTH PAK) 1 MG tablet, Take 1 tablet (1 mg total) by mouth 2 (two) times daily. (Patient not taking: Reported on 03/11/2017), Disp: 120 tablet, Rfl: 0 .  varenicline (CHANTIX STARTING MONTH PAK) 0.5 MG X 11 & 1 MG X 42 tablet, Take one 0.5 mg tablet PO once daily for 3 days, then increase to one 0.5 mg tablet bid for 4 days, then increase to one 1 mg tablet bid (Patient not taking: Reported on 03/11/2017), Disp: 53 tablet, Rfl: 0   Review of Systems  Constitutional: Positive for diaphoresis and fatigue. Negative for appetite change, chills, fever and unexpected weight change.  HENT: Positive for sore throat, trouble swallowing and voice change. Negative for congestion, drooling, ear pain, facial swelling, hearing loss, mouth sores and sneezing.   Eyes: Negative for pain,  discharge, redness, itching and visual disturbance.  Respiratory: Positive for cough, chest tightness, shortness of breath and wheezing. Negative for choking.   Cardiovascular: Negative for chest pain, palpitations and leg swelling.  Gastrointestinal: Negative for abdominal pain, blood in stool, constipation, diarrhea and vomiting.  Endocrine: Negative for cold intolerance, heat intolerance and polydipsia.  Genitourinary: Negative for decreased urine volume, dysuria and hematuria.  Musculoskeletal: Negative for arthralgias, back pain and gait problem.  Skin: Negative for rash.  Allergic/Immunologic: Negative for environmental allergies.  Neurological: Negative for seizures, syncope,  light-headedness and headaches.  Hematological: Negative for adenopathy.  Psychiatric/Behavioral: Positive for dysphoric mood. Negative for agitation and suicidal ideas. The patient is nervous/anxious.     Per HPI unless specifically indicated above     Objective:    BP 100/60 (BP Location: Left Arm, Patient Position: Sitting, Cuff Size: Normal)   Pulse 89   Temp 98.1 F (36.7 C)   Ht 4\' 11"  (1.499 m)   Wt 72 lb 12 oz (33 kg)   LMP 03/09/2012   SpO2 96%   BMI 14.69 kg/m   Wt Readings from Last 3 Encounters:  03/24/17 72 lb 12 oz (33 kg)  03/17/17 72 lb (32.7 kg)  03/11/17 72 lb (32.7 kg)    Physical Exam  Constitutional: She is oriented to person, place, and time. She appears cachectic.  HENT:  Head: Normocephalic.  Neck: Neck supple.  Cardiovascular: Normal rate and regular rhythm.   Pulmonary/Chest: Effort normal and breath sounds normal. No respiratory distress.  Musculoskeletal: She exhibits no edema.  Neurological: She is alert and oriented to person, place, and time.  Skin: Skin is warm and dry.  Psychiatric: Her speech is normal and behavior is normal. Thought content normal. She exhibits a depressed mood.  Tearful at times  Nursing note and vitals reviewed.       Assessment & Plan:   Encounter Diagnoses  Name Primary?  . Essential hypertension Yes  . Chronic obstructive pulmonary disease, unspecified COPD type ([place])   . Cigarette nicotine dependence with nicotine-induced disorder   . 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 Underweight   . Depression, unspecified depression type       -will decrease bp medication.  Pt to Take only 1/2 of the lisinopril daily.  Discussed with her that it may be stopped altogether in the next month or two if her bp continues to go down -gave pt the phone number for the financial counselor at [facility] who can helpfully answer pt's questions and help her get the medicaid that she desperately needs -discussed with  pt that she needs a nebulizer machine because she is likely not getting appropriate medication from her inhaler in light of her tumor -pt still has card for [facility] and discussed their walk-in services if she has the opportunity to go to get counseling.   -pt to follow up one month to recheck bp and address any needs she has  (The duration of this appointment visit was 25 minutes of face-to-face time with the patient.  Greater than 50% of this time was spent in counseling, explanation of diagnosis, planning of further management, and coordination of care.)

## 2017-03-24 NOTE — Patient Instructions (Signed)
SHIRLINE KENDLE  03/24/2017   Your procedure is scheduled on: 03/26/17  Report to [facility] Main  Entrance Take [place]  elevators to 3rd floor to  [place] at     [phone].    Call this number if you have problems the morning of surgery [phone]   Remember: ONLY 1 PERSON MAY GO WITH YOU TO SHORT STAY TO GET  READY MORNING OF YOUR SURGERY.  Do not eat food or drink liquids :After Midnight.     Take these medicines the morning of surgery with A SIP OF WATER: inhalers if needed ( bring)                                You may not have any metal on your body including hair pins and              piercings  Do not wear jewelry, make-up, lotions, powders or perfumes, deodorant             Do not wear nail polish.  Do not shave  48 hours prior to surgery.              Do not bring valuables to the hospital. [place].  Contacts, dentures or bridgework may not be worn into surgery.  Leave suitcase in the car. After surgery it may be brought to your room.     Patients discharged the day of surgery will not be allowed to drive home.  Name and phone number of your driver:  Special Instructions: N/A              Please read over the following fact sheets you were given: _____________________________________________________________________             [facility] - Preparing for Surgery Before surgery, you can play an important role.  Because skin is not sterile, your skin needs to be as free of germs as possible.  You can reduce the number of germs on your skin by washing with CHG (chlorahexidine gluconate) soap before surgery.  CHG is an antiseptic cleaner which kills germs and bonds with the skin to continue killing germs even after washing. Please DO NOT use if you have an allergy to CHG or antibacterial soaps.  If your skin becomes reddened/irritated stop using the CHG and inform your nurse when you  arrive at Short Stay. Do not shave (including legs and underarms) for at least 48 hours prior to the first CHG shower.  You may shave your face/neck. Please follow these instructions carefully:  1.  Shower with CHG Soap the night before surgery and the  morning of Surgery.  2.  If you choose to wash your hair, wash your hair first as usual with your  normal  shampoo.  3.  After you shampoo, rinse your hair and body thoroughly to remove the  shampoo.                           4.  Use CHG as you would any other liquid soap.  You can apply chg directly  to the skin and wash  Gently with a scrungie or clean washcloth.  5.  Apply the CHG Soap to your body ONLY FROM THE NECK DOWN.   Do not use on face/ open                           Wound or open sores. Avoid contact with eyes, ears mouth and genitals (private parts).                       Wash face,  Genitals (private parts) with your normal soap.             6.  Wash thoroughly, paying special attention to the area where your surgery  will be performed.  7.  Thoroughly rinse your body with warm water from the neck down.  8.  DO NOT shower/wash with your normal soap after using and rinsing off  the CHG Soap.                9.  Pat yourself dry with a clean towel.            10.  Wear clean pajamas.            11.  Place clean sheets on your bed the night of your first shower and do not  sleep with pets. Day of Surgery : Do not apply any lotions/deodorants the morning of surgery.  Please wear clean clothes to the hospital/surgery center.  FAILURE TO FOLLOW THESE INSTRUCTIONS MAY RESULT IN THE CANCELLATION OF YOUR SURGERY PATIENT SIGNATURE_________________________________  NURSE SIGNATURE__________________________________  ________________________________________________________________________

## 2017-03-24 NOTE — Progress Notes (Addendum)
Ok for same day per dr Tommie Raymond due to transportation issues unable to come  for pre op.dr Enrique Sack aware patient may not have transportation home day of surgery.

## 2017-03-25 ENCOUNTER — Inpatient Hospital Stay (HOSPITAL_COMMUNITY)
Admission: RE | Admit: 2017-03-25 | Discharge: 2017-03-25 | Disposition: A | Discharge status: Home / Self Care | Payer: Self-pay | Source: Ambulatory Visit

## 2017-03-26 ENCOUNTER — Ambulatory Visit (HOSPITAL_COMMUNITY): Payer: Medicaid Other | Admitting: Certified Registered Nurse Anesthetist

## 2017-03-26 ENCOUNTER — Encounter (HOSPITAL_COMMUNITY): Payer: Self-pay

## 2017-03-26 ENCOUNTER — Ambulatory Visit (HOSPITAL_COMMUNITY)
Admission: RE | Admit: 2017-03-26 | Discharge: 2017-03-26 | Disposition: A | Discharge status: Home / Self Care | Payer: Medicaid Other | Source: Ambulatory Visit | Attending: Dentistry | Admitting: Dentistry

## 2017-03-26 ENCOUNTER — Encounter (HOSPITAL_COMMUNITY)
Admission: RE | Disposition: A | Discharge status: Home / Self Care | Payer: Self-pay | Source: Ambulatory Visit | Attending: Dentistry

## 2017-03-26 DIAGNOSIS — K045 Chronic apical periodontitis: Secondary | ICD-10-CM

## 2017-03-26 DIAGNOSIS — J449 Chronic obstructive pulmonary disease, unspecified: Secondary | ICD-10-CM | POA: Insufficient documentation

## 2017-03-26 DIAGNOSIS — I10 Essential (primary) hypertension: Secondary | ICD-10-CM | POA: Diagnosis not present

## 2017-03-26 DIAGNOSIS — F329 Major depressive disorder, single episode, unspecified: Secondary | ICD-10-CM | POA: Diagnosis not present

## 2017-03-26 DIAGNOSIS — K0889 Other specified disorders of teeth and supporting structures: Secondary | ICD-10-CM

## 2017-03-26 DIAGNOSIS — Z79899 Other long term (current) drug therapy: Secondary | ICD-10-CM | POA: Diagnosis not present

## 2017-03-26 DIAGNOSIS — F17210 Nicotine dependence, cigarettes, uncomplicated: Secondary | ICD-10-CM | POA: Diagnosis not present

## 2017-03-26 DIAGNOSIS — C321 Malignant neoplasm of supraglottis: Secondary | ICD-10-CM | POA: Insufficient documentation

## 2017-03-26 DIAGNOSIS — K219 Gastro-esophageal reflux disease without esophagitis: Secondary | ICD-10-CM | POA: Insufficient documentation

## 2017-03-26 DIAGNOSIS — K0530 Chronic periodontitis, unspecified: Secondary | ICD-10-CM

## 2017-03-26 DIAGNOSIS — F419 Anxiety disorder, unspecified: Secondary | ICD-10-CM | POA: Diagnosis not present

## 2017-03-26 DIAGNOSIS — C320 Malignant neoplasm of glottis: Secondary | ICD-10-CM

## 2017-03-26 DIAGNOSIS — K029 Dental caries, unspecified: Secondary | ICD-10-CM

## 2017-03-26 HISTORY — DX: Burn of unspecified body region, unspecified degree: T30.0

## 2017-03-26 HISTORY — DX: Carpal tunnel syndrome, unspecified upper limb: G56.00

## 2017-03-26 HISTORY — PX: MULTIPLE EXTRACTIONS WITH ALVEOLOPLASTY: SHX5342

## 2017-03-26 HISTORY — DX: Malignant (primary) neoplasm, unspecified: C80.1

## 2017-03-26 SURGERY — MULTIPLE EXTRACTION WITH ALVEOLOPLASTY
Anesthesia: General | Site: Mouth

## 2017-03-26 MED ORDER — BUPIVACAINE-EPINEPHRINE (PF) 0.5% -1:200000 IJ SOLN
INTRAMUSCULAR | Status: DC | PRN
  Administered 2017-03-26: 3.2 mL

## 2017-03-26 MED ORDER — FENTANYL CITRATE (PF) 100 MCG/2ML IJ SOLN
INTRAMUSCULAR | Status: AC
  Filled 2017-03-26: qty 2

## 2017-03-26 MED ORDER — FENTANYL CITRATE (PF) 100 MCG/2ML IJ SOLN
25.0000 ug | INTRAMUSCULAR | Status: DC | PRN
  Administered 2017-03-26: 25 ug via INTRAVENOUS

## 2017-03-26 MED ORDER — ONDANSETRON HCL 4 MG/2ML IJ SOLN
INTRAMUSCULAR | Status: DC | PRN
  Administered 2017-03-26: 4 mg via INTRAVENOUS

## 2017-03-26 MED ORDER — PHENYLEPHRINE HCL 10 MG/ML IJ SOLN
INTRAMUSCULAR | Status: DC | PRN
  Administered 2017-03-26: 80 ug via INTRAVENOUS
  Administered 2017-03-26: 40 ug via INTRAVENOUS
  Administered 2017-03-26: 80 ug via INTRAVENOUS
  Administered 2017-03-26: 40 ug via INTRAVENOUS
  Administered 2017-03-26: 80 ug via INTRAVENOUS

## 2017-03-26 MED ORDER — PHENYLEPHRINE 40 MCG/ML (10ML) SYRINGE FOR IV PUSH (FOR BLOOD PRESSURE SUPPORT)
PREFILLED_SYRINGE | INTRAVENOUS | Status: AC
  Filled 2017-03-26: qty 10

## 2017-03-26 MED ORDER — OXYMETAZOLINE HCL 0.05 % NA SOLN
NASAL | Status: AC
  Filled 2017-03-26: qty 15

## 2017-03-26 MED ORDER — LACTATED RINGERS IV SOLN
INTRAVENOUS | Status: DC | PRN
  Administered 2017-03-26: 07:00:00 via INTRAVENOUS

## 2017-03-26 MED ORDER — CLINDAMYCIN PHOSPHATE 600 MG/50ML IV SOLN
600.0000 mg | Freq: Once | INTRAVENOUS | Status: AC
  Administered 2017-03-26: 600 mg via INTRAVENOUS
  Filled 2017-03-26: qty 50

## 2017-03-26 MED ORDER — ALBUTEROL SULFATE (2.5 MG/3ML) 0.083% IN NEBU
INHALATION_SOLUTION | RESPIRATORY_TRACT | Status: AC
  Filled 2017-03-26: qty 3

## 2017-03-26 MED ORDER — FENTANYL CITRATE (PF) 100 MCG/2ML IJ SOLN
INTRAMUSCULAR | Status: DC | PRN
  Administered 2017-03-26: 25 ug via INTRAVENOUS
  Administered 2017-03-26: 75 ug via INTRAVENOUS

## 2017-03-26 MED ORDER — OXYCODONE HCL 5 MG/5ML PO SOLN
5.0000 mg | Freq: Once | ORAL | Status: AC | PRN
  Filled 2017-03-26: qty 5

## 2017-03-26 MED ORDER — BUPIVACAINE-EPINEPHRINE (PF) 0.5% -1:200000 IJ SOLN
INTRAMUSCULAR | Status: AC
  Filled 2017-03-26: qty 3.6

## 2017-03-26 MED ORDER — MIDAZOLAM HCL 2 MG/2ML IJ SOLN
INTRAMUSCULAR | Status: AC
  Filled 2017-03-26: qty 2

## 2017-03-26 MED ORDER — LIDOCAINE HCL 2 % EX GEL
CUTANEOUS | Status: AC
  Filled 2017-03-26: qty 5

## 2017-03-26 MED ORDER — SUGAMMADEX SODIUM 200 MG/2ML IV SOLN
INTRAVENOUS | Status: DC | PRN
  Administered 2017-03-26: 200 mg via INTRAVENOUS

## 2017-03-26 MED ORDER — ISOPROPYL ALCOHOL 70 % SOLN
Status: DC | PRN
  Administered 2017-03-26: 1 via TOPICAL

## 2017-03-26 MED ORDER — ACETAMINOPHEN 325 MG PO TABS: 325.0000 mg | ORAL_TABLET | ORAL | Status: DC | PRN

## 2017-03-26 MED ORDER — ACETAMINOPHEN 160 MG/5ML PO SOLN: 325.0000 mg | ORAL | Status: DC | PRN

## 2017-03-26 MED ORDER — MIDAZOLAM HCL 5 MG/5ML IJ SOLN
INTRAMUSCULAR | Status: DC | PRN
  Administered 2017-03-26 (×2): 1 mg via INTRAVENOUS

## 2017-03-26 MED ORDER — ONDANSETRON HCL 4 MG/2ML IJ SOLN
INTRAMUSCULAR | Status: AC
  Filled 2017-03-26: qty 2

## 2017-03-26 MED ORDER — DEXAMETHASONE SODIUM PHOSPHATE 10 MG/ML IJ SOLN
INTRAMUSCULAR | Status: DC | PRN
  Administered 2017-03-26: 10 mg via INTRAVENOUS

## 2017-03-26 MED ORDER — LIDOCAINE-EPINEPHRINE 2 %-1:100000 IJ SOLN
INTRAMUSCULAR | Status: DC | PRN
  Administered 2017-03-26: 5.1 mL

## 2017-03-26 MED ORDER — LACTATED RINGERS IV SOLN: INTRAVENOUS | Status: DC

## 2017-03-26 MED ORDER — STERILE WATER FOR IRRIGATION IR SOLN
Status: DC | PRN
  Administered 2017-03-26: 1000 mL

## 2017-03-26 MED ORDER — LIDOCAINE-EPINEPHRINE 2 %-1:100000 IJ SOLN
INTRAMUSCULAR | Status: AC
  Filled 2017-03-26: qty 10.2

## 2017-03-26 MED ORDER — ROCURONIUM BROMIDE 50 MG/5ML IV SOSY
PREFILLED_SYRINGE | INTRAVENOUS | Status: AC
  Filled 2017-03-26: qty 5

## 2017-03-26 MED ORDER — SUCCINYLCHOLINE CHLORIDE 200 MG/10ML IV SOSY
PREFILLED_SYRINGE | INTRAVENOUS | Status: DC | PRN
  Administered 2017-03-26: 100 mg via INTRAVENOUS

## 2017-03-26 MED ORDER — PROPOFOL 10 MG/ML IV BOLUS
INTRAVENOUS | Status: DC | PRN
  Administered 2017-03-26: 140 mg via INTRAVENOUS

## 2017-03-26 MED ORDER — ISOPROPYL ALCOHOL 70 % SOLN
Status: AC
  Filled 2017-03-26: qty 480

## 2017-03-26 MED ORDER — DEXAMETHASONE SODIUM PHOSPHATE 10 MG/ML IJ SOLN
INTRAMUSCULAR | Status: AC
  Filled 2017-03-26: qty 1

## 2017-03-26 MED ORDER — ROCURONIUM BROMIDE 50 MG/5ML IV SOSY
PREFILLED_SYRINGE | INTRAVENOUS | Status: DC | PRN
  Administered 2017-03-26: 30 mg via INTRAVENOUS

## 2017-03-26 MED ORDER — HEPARIN SOD (PORK) LOCK FLUSH 100 UNIT/ML IV SOLN
250.0000 [IU] | INTRAVENOUS | Status: AC | PRN
Start: 2017-03-26 — End: 2017-03-26
  Administered 2017-03-26: 250 [IU]
  Filled 2017-03-26: qty 5

## 2017-03-26 MED ORDER — OXYCODONE-ACETAMINOPHEN 5-325 MG PO TABS: 1.0000 | ORAL_TABLET | Freq: Four times a day (QID) | ORAL | Status: DC | PRN

## 2017-03-26 MED ORDER — LIDOCAINE 2% (20 MG/ML) 5 ML SYRINGE
INTRAMUSCULAR | Status: DC | PRN
  Administered 2017-03-26: 60 mg via INTRAVENOUS

## 2017-03-26 MED ORDER — OXYCODONE-ACETAMINOPHEN 5-325 MG PO TABS: ORAL_TABLET | ORAL | 0 refills | Status: DC

## 2017-03-26 MED ORDER — 0.9 % SODIUM CHLORIDE (POUR BTL) OPTIME
TOPICAL | Status: DC | PRN
  Administered 2017-03-26: 1000 mL

## 2017-03-26 MED ORDER — FENTANYL CITRATE (PF) 100 MCG/2ML IJ SOLN
INTRAMUSCULAR | Status: AC
  Administered 2017-03-26: 25 ug via INTRAVENOUS
  Filled 2017-03-26: qty 2

## 2017-03-26 MED ORDER — PROPOFOL 10 MG/ML IV BOLUS
INTRAVENOUS | Status: AC
  Filled 2017-03-26: qty 20

## 2017-03-26 MED ORDER — LIDOCAINE 2% (20 MG/ML) 5 ML SYRINGE
INTRAMUSCULAR | Status: AC
  Filled 2017-03-26: qty 5

## 2017-03-26 MED ORDER — OXYCODONE HCL 5 MG PO TABS
5.0000 mg | ORAL_TABLET | Freq: Once | ORAL | Status: AC | PRN
  Administered 2017-03-26: 5 mg via ORAL
  Filled 2017-03-26: qty 1

## 2017-03-26 SURGICAL SUPPLY — 31 items
ATTRACTOMAT 16X20 MAGNETIC DRP (DRAPES) ×3 IMPLANT
BAG SPEC THK2 15X12 ZIP CLS (MISCELLANEOUS) ×1
BAG ZIPLOCK 12X15 (MISCELLANEOUS) ×3 IMPLANT
BANDAGE EYE OVAL (MISCELLANEOUS) ×6 IMPLANT
BLADE SURG 15 STRL LF DISP TIS (BLADE) ×2 IMPLANT
BLADE SURG 15 STRL SS (BLADE) ×6
CANNULA VESSEL W/WING WO/VALVE (CANNULA) ×3 IMPLANT
COVER SURGICAL LIGHT HANDLE (MISCELLANEOUS) ×3 IMPLANT
GAUZE SPONGE 4X4 12PLY STRL (GAUZE/BANDAGES/DRESSINGS) ×3 IMPLANT
GAUZE SPONGE 4X4 16PLY XRAY LF (GAUZE/BANDAGES/DRESSINGS) ×3 IMPLANT
GLOVE BIOGEL PI IND STRL 6 (GLOVE) ×1 IMPLANT
GLOVE BIOGEL PI INDICATOR 6 (GLOVE) ×2
GLOVE SURG ORTHO 8.0 STRL STRW (GLOVE) ×3 IMPLANT
GLOVE SURG SS PI 6.0 STRL IVOR (GLOVE) ×3 IMPLANT
GOWN STRL REUS W/TWL 2XL LVL3 (GOWN DISPOSABLE) ×3 IMPLANT
GOWN STRL REUS W/TWL LRG LVL3 (GOWN DISPOSABLE) ×3 IMPLANT
KIT BASIN OR (CUSTOM PROCEDURE TRAY) ×3 IMPLANT
NEEDLE DENTAL RB 25GX1.25 (NEEDLE) ×6 IMPLANT
PACK EENT SPLIT (PACKS) ×3 IMPLANT
PACKING VAGINAL (PACKING) ×3 IMPLANT
SPONGE SURGIFOAM ABS GEL 12-7 (HEMOSTASIS) ×2 IMPLANT
SUCTION FRAZIER HANDLE 12FR (TUBING) ×2
SUCTION TUBE FRAZIER 12FR DISP (TUBING) IMPLANT
SUT CHROMIC 3 0 PS 2 (SUTURE) ×9 IMPLANT
SUT CHROMIC 4 0 P 3 18 (SUTURE) IMPLANT
SYR 50ML LL SCALE MARK (SYRINGE) ×3 IMPLANT
TOWEL OR 17X26 10 PK STRL BLUE (TOWEL DISPOSABLE) ×3 IMPLANT
TUBING CONNECTING 10 (TUBING) ×2 IMPLANT
TUBING CONNECTING 10' (TUBING) ×1
WATER TABLETS ICX (MISCELLANEOUS) ×3 IMPLANT
YANKAUER SUCT BULB TIP NO VENT (SUCTIONS) ×3 IMPLANT

## 2017-03-26 NOTE — Anesthesia Procedure Notes (Addendum)
Procedure Name: Intubation Date/Time: 03/26/2017 7:50 AM Performed by: Maxwell Caul Pre-anesthesia Checklist: Patient identified, Emergency Drugs available, Suction available and Patient being monitored Patient Re-evaluated:Patient Re-evaluated prior to induction Oxygen Delivery Method: Circle system utilized Preoxygenation: Pre-oxygenation with 100% oxygen Induction Type: IV induction Ventilation: Mask ventilation without difficulty Laryngoscope Size: Glidescope and 3 Grade View: Grade I Tube size: 6.5 mm Number of attempts: 1 Airway Equipment and Method: Rigid stylet and Video-laryngoscopy

## 2017-03-26 NOTE — Transfer of Care (Signed)
Immediate Anesthesia Transfer of Care Note  Patient: Jamie Salinas  Procedure(s) Performed: Procedure(s): Extraction of tooth #'s 2,5-8, 20-23, 59 and 29 with alveoloplasty (N/A)  Patient Location: PACU  Anesthesia Type:General  Level of Consciousness:  sedated, patient cooperative and responds to stimulation  Airway & Oxygen Therapy:Patient Spontanous Breathing and Patient connected to face mask oxgen  Post-op Assessment:  Report given to PACU RN and Post -op Vital signs reviewed and stable  Post vital signs:  Reviewed and stable  Last Vitals:  Vitals:   03/26/17 0517  BP: 110/60  Pulse: 85  Resp: 18  Temp: 36.7 C  SpO2: 081%    Complications: No apparent anesthesia complications

## 2017-03-26 NOTE — Op Note (Signed)
OPERATIVE REPORT  Patient:            Jamie Salinas Date of Birth:  Nov 03, 1965 MRN:                [id number]   DATE OF PROCEDURE:  03/26/2017  PREOPERATIVE DIAGNOSES: 1. Squamous cell carcinoma of the supraglottic larynx 2. Pre-chemoradiation therapy dental protocol 3. Chronic apical periodontitis 4. Dental caries 5. Chronic periodontitis 6. Loose teeth  POSTOPERATIVE DIAGNOSES: 1. Squamous cell carcinoma of the supraglottic larynx 2. Pre-chemoradiation therapy dental protocol 3. Chronic apical periodontitis 4. Dental caries 5. Chronic periodontitis 6. Loose teeth  OPERATIONS: 1. Multiple extraction of tooth numbers 2, 5-8, 20-23, 28, and 29 2. 3 Quadrants of alveoloplasty   SURGEON: Lenn Cal, DDS  ASSISTANT: Camie Patience, (dental assistant)  ANESTHESIA: General anesthesia via oral endotracheal tube.  MEDICATIONS: 1. Ancef 2 g IV prior to invasive dental procedures. 2. Local anesthesia with a total utilization of 3 carpules each containing 34 mg of lidocaine with 0.017 mg of epinephrine as well as 2 carpules each containing 9 mg of bupivacaine with 0.009 mg of epinephrine.  SPECIMENS: There are 11 teeth that were discarded.  DRAINS: None  CULTURES: None  COMPLICATIONS: None   ESTIMATED BLOOD LOSS: less than 50 mLs.  INTRAVENOUS FLUIDS: 500 mLs of Lactated ringers solution.  INDICATIONS: The patient was recently diagnosed with Squamous cell carcinoma of the supraglottic larynx.  A medically necessary dental consultation was then requested to evaluate poor dentition.  The patient was examined and treatment planned for extraction of remaining teeth with alveoloplasty in the operating room with general anesthesia.  This treatment plan was formulated to decrease the risks and complications associated with dental infection from affecting the patient's systemic health and to prevent future complications such as infection and  osteoradionecrosis.  OPERATIVE FINDINGS: Patient was examined in operating room number 4.  The teeth were identified for extraction. The patient was noted be affected by chronic periodontitis, chronic apical periodontitis, dental caries, and loose teeth.   DESCRIPTION OF PROCEDURE: Patient was brought to the main operating room number 4. Patient was then placed in the supine position on the operating table. General anesthesia was then induced per the anesthesia team. The patient was then prepped and draped in the usual manner for dental medicine procedure. A timeout was performed. The patient was identified and procedures were verified. A throat pack was placed at this time. The oral cavity was then thoroughly examined with the findings noted above. The patient was then ready for dental medicine procedure as follows:  Local anesthesia was then administered sequentially with a total utilization of 3 carpules each containing 34 mg of lidocaine with 0.017 mg of epinephrine as well as 2 carpules  each containing 9 mg bupivacaine with 0.009 mg of epinephrine.  The Maxillary right quadrant was first approached. Anesthesia was then delivered utilizing infiltration with lidocaine with epinephrine. A #15 blade incision was then made from the the maxillary right tuberosity and extended to the distal of #9.  A  surgical flap was then carefully reflected. The maxillary teeth were then subluxated with a series of straight elevators. Tooth numbers 2, 5, 6, 7, and 8 were then removed with a 150 forceps without complications. Alveoloplasty was then performed utilizing a ronguers and bone file. The surgical site was then irrigated with copious amounts of sterile saline. The tissues were approximated and trimmed appropriately. A piece of Surgifoam was placed in extraction sockets area #2.  The surgical site was then closed from the maxillary right tuberosity and extended to the distal of #9 utilizing 3-0 chromic gut suture  in a continuous interrupted suture technique 1.  At this point time, the mandibular quadrants were approached. The patient was given bilateral inferior alveolar nerve blocks and long buccal nerve blocks utilizing the bupivacaine with epinephrine. Further infiltration was then achieved utilizing the lidocaine with epinephrine. A 15 blade incision was then made from the distal of number 19 and extended to the mesial of #24. A second 15 blade incision was made from the distal of #30 and extended the mesial #26.  Surgical flaps were then carefully reflected. The teeth were then subluxated with a series of straight elevators. Tooth numbers  20-23, 28, and 29 were then removed with a 151 forceps without complications. Alveoloplasty was then performed utilizing a rongeurs and bone file to help achieve primary closure. The tissues were approximated and trimmed appropriately. The surgical sites were then irrigated with copious amounts of sterile saline. The mandibular left surgical site was then closed from the distal of #19 and extended the mesial of #24 utilizing 3-0 chromic gut suture in a continuous interrupted suture technique 1. The mandibular right surgical site was then closed from the distal of #30 and extended the mesial #26 utilizing 3-0 chromic gut suture in a continuous interrupted suture technique 1.   At this point time, the entire mouth was irrigated with copious amounts of sterile saline. The patient was examined for complications, seeing none, the dental medicine procedure was deemed to be complete. The throat pack was removed at this time. A series of 4 x 4 gauze were placed in the mouth to aid hemostasis. The patient was then handed over to the anesthesia team for final disposition. After an appropriate amount of time, the patient was extubated and taken to the postanesthsia care unit in good condition. All counts were correct for the dental medicine procedure.Patient will be seen in 7-10 days for  evaluation of healing and suture removal. Patient should be ready for start of chemoradiation therapy and 14 days barring any significant postoperative complication.   Lenn Cal, DDS.

## 2017-03-26 NOTE — H&P (Signed)
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 Patient with anticipated chemoradiation therapy. Patient was seen as part of a medically necessary pre-chemotherapy ration therapy dental protocol examination. The patient now presents for multiple dental extractions with alveoloplasty in the operating room with general anesthesia. The patient currently denies any acute medical or dental changes. Please see note from Dr.Zhou dated 03/11/2017 to act as H&P for dental operating room procedure. Please also see recent note from the physician's assistant with radiation oncology from 03/24/17.  Jamie Salinas, DDS   Soyla Dryer, PA-C  Physician Assistant    _1 Hide copied text   BP 100/60 (BP Location: Left Arm, Patient Position: Sitting, Cuff Size: Normal)   Pulse 89   Temp 98.1 F (36.7 C)   Ht _2  (1.499 m)   Wt 72 lb 12 oz (33 kg)   LMP 03/09/2012   SpO2 96%   BMI 14.69 kg/m    Subjective:    Patient ID: Jamie Salinas, female    DOB: [birth year], 51 y.o.   MRN: [id number]  HPI: Jamie Salinas is a 51 y.o. female presenting on 03/24/2017 for Hypertension and COPD   HPI   Pt's friend is with her today  Pt says chantix made her sick so she stopped taking it.  She is still smoking a little bit.    She says she usually only smokes a handful.  She says at one point in her life she was smoking 3 or 4 ppd.    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 She says she has lawyers  calling her all the time and even knocking on her door.     Pt says she has appt this afternoon to discuss feeding tube placement with the surgeon and is having the remainder of her teeth pulled on thursday  Relevant past medical, surgical, family and social history reviewed and updated as indicated. Interim medical history since our last visit reviewed. Allergies and medications reviewed and updated.   Current Outpatient Prescriptions:  .  albuterol (PROVENTIL HFA;VENTOLIN HFA) 108 (90 Base) MCG/ACT inhaler, Inhale 2 puffs into the lungs every 6 (six) hours as needed for wheezing or shortness of breath. (Patient taking differently: Inhale 2 puffs into the lungs every 4 (four) hours as needed for wheezing or shortness of breath. ), Disp: 3 Inhaler, Rfl: 1 .  albuterol (PROVENTIL) (2.5 MG/3ML) 0.083% nebulizer solution, Take 3 mLs (2.5 mg total) by nebulization every 6 (six) hours as needed for wheezing or shortness of breath., Disp: 150 mL, Rfl: 1 .  lactose free nutrition (BOOST) LIQD, Take 237 mLs by mouth 3 (three) times daily between meals., Disp: , Rfl:  .  lisinopril (PRINIVIL,ZESTRIL) 10 MG tablet, Take 1 tablet (10  mg total) by mouth daily., Disp: 30 tablet, Rfl: 1 .  ALPRAZolam (XANAX) 1 MG tablet, Take 0.5 mg by mouth at bedtime. , Disp: , Rfl:  .  oxyCODONE-acetaminophen (ROXICET) 5-325 MG tablet, Take 1 tablet by mouth every 6 (six) hours as needed for severe pain. (Patient not taking: Reported on 03/24/2017), Disp: 20 tablet, Rfl: 0 .  varenicline (CHANTIX CONTINUING MONTH PAK) 1 MG tablet, Take 1 tablet (1 mg total) by mouth 2 (two) times daily. (Patient not taking: Reported on 03/11/2017), Disp: 120 tablet, Rfl: 0 .  varenicline (CHANTIX STARTING MONTH PAK) 0.5 MG X 11 & 1 MG X 42 tablet, Take one 0.5 mg tablet PO once daily for 3 days, then increase to one 0.5 mg tablet bid for 4 days, then increase to one 1 mg tablet bid (Patient not taking: Reported on 03/11/2017), Disp: 53 tablet,  Rfl: 0   Review of Systems  Constitutional: Positive for diaphoresis and fatigue. Negative for appetite change, chills, fever and unexpected weight change.  HENT: Positive for sore throat, trouble swallowing and voice change. Negative for congestion, drooling, ear pain, facial swelling, hearing loss, mouth sores and sneezing.   Eyes: Negative for pain, discharge, redness, itching and visual disturbance.  Respiratory: Positive for cough, chest tightness, shortness of breath and wheezing. Negative for choking.   Cardiovascular: Negative for chest pain, palpitations and leg swelling.  Gastrointestinal: Negative for abdominal pain, blood in stool, constipation, diarrhea and vomiting.  Endocrine: Negative for cold intolerance, heat intolerance and polydipsia.  Genitourinary: Negative for decreased urine volume, dysuria and hematuria.  Musculoskeletal: Negative for arthralgias, back pain and gait problem.  Skin: Negative for rash.  Allergic/Immunologic: Negative for environmental allergies.  Neurological: Negative for seizures, syncope, light-headedness and headaches.  Hematological: Negative for adenopathy.  Psychiatric/Behavioral: Positive for dysphoric mood. Negative for agitation and suicidal ideas. The patient is nervous/anxious.     Per HPI unless specifically indicated above     Objective:  BP 100/60 (BP Location: Left Arm, Patient Position: Sitting, Cuff Size: Normal)   Pulse 89   Temp 98.1 F (36.7 C)   Ht _0  (1.499 m)   Wt 72 lb 12 oz (33 kg)   LMP 03/09/2012   SpO2 96%   BMI 14.69 kg/m   Wt Readings from Last 3 Encounters:  03/24/17 72 lb 12 oz (33 kg)  03/17/17 72 lb (32.7 kg)  03/11/17 72 lb (32.7 kg)    Physical Exam  Constitutional: She is oriented to person, place, and time. She appears cachectic.  HENT:  Head: Normocephalic.  Neck: Neck supple.  Cardiovascular: Normal rate and regular rhythm.   Pulmonary/Chest: Effort normal and breath sounds normal.  No respiratory distress.  Musculoskeletal: She exhibits no edema.  Neurological: She is alert and oriented to person, place, and time.  Skin: Skin is warm and dry.  Psychiatric: Her speech is normal and behavior is normal. Thought content normal. She exhibits a depressed mood.  Tearful at times  Nursing note and vitals reviewed.       Assessment & Plan:       Encounter Diagnoses  Name Primary?  . Essential hypertension Yes  . Chronic obstructive pulmonary disease, unspecified COPD type ([place])   . Cigarette nicotine dependence with nicotine-induced disorder   . Squamous cell carcinoma of vocal cord ([place])   . Underweight   . Depression, unspecified depression type       -will decrease bp medication.  Pt to Take only 1/2 of  the lisinopril daily.  Discussed with her that it may be stopped altogether in the next month or two if her bp continues to go down -gave pt the phone number for the financial counselor at [facility] who can helpfully answer pt's questions and help her get the medicaid that she desperately needs -discussed with pt that she needs a nebulizer machine because she is likely not getting appropriate medication from her inhaler in light of her tumor -pt still has card for [facility] and discussed their walk-in services if she has the opportunity to go to get counseling.   -pt to follow up one month to recheck bp and address any needs she has  (The duration of this appointment visit was 25 minutes of face-to-face time with the patient.  Greater than 50% of this time was spent in counseling, explanation of diagnosis, planning of further management, and coordination of care.)     Electronically signed by Soyla Dryer, PA-C at 03/24/2017 12:51 PM      Office Visit on 03/24/2017        Detailed Report        Progress Notes Encounter Date: 03/11/2017 Twana First, MD  Medical Oncology    _0 Hide copied text [place] Initial  Visit:  Patient Care Team: Soyla Dryer, Hershal Coria as PCP - General (Physician Assistant)  CHIEF COMPLAINTS/PURPOSE OF CONSULTATION:  squamous cell carcinoma of the right supraglottic larynx.  HISTORY OF PRESENTING ILLNESS: MILIANI DEIKE 51 y.o. female is here because ofsquamous cell carcinoma of the right supraglottic larynx. Patient was referred by the free clinic at [facility]. Patient has been having progressive hoarseness for the past 2 years. She also has a persistent burning sensation in her throat which would radiate to her right ear. She states she's had unintentional 20 pound weight loss over the past few years. Patient has a history of more than 40 years of smoking 2-3 packs per day. She states she currently has cut down to one pack per day.   Patient was referred to Dr. Benjamine Mola and underwent a direct laryngoscopy on 03/03/17 which demonstrated a fungating mass was noted to erode and occupied the right aryepiglottic fold. The mass appearedto extend down to the right vocal cord. The left vocal cord was noted to be edematous. Biopsies were taken. Surgical path from the left vocal cord biopsy demonstrated reactive squamous mucosa, no malignancy identified. Soft tissue mass biopsy of right supraglottic mass demonstrated invasive squamous cell carcinoma.  Patient stated that she does not have any dysphagia. She states that she has been eating a lot since the time of her biopsy. She states that her hoarseness has worsened since the biopsy. She has a very poor dentition and was in the process of getting all her teeth pulled out however then she found out about the squamous cell cancer. She denies any chest pain, shortness breath, abdominal pain, focal weakness. 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  Review of Systems  Constitutional: Positive for unexpected weight change. Negative for appetite change and fatigue.  HENT:         Hoarseness  Eyes: Negative.   Respiratory: Negative.    Cardiovascular: Negative.   Gastrointestinal: Negative.   Endocrine: Negative.   Genitourinary: Negative.    Musculoskeletal: Negative.   Skin: Negative.   Neurological: Negative.   Hematological: Negative.   Psychiatric/Behavioral: The patient is nervous/anxious.     MEDICAL HISTORY:     Past Medical History:  Diagnosis Date  .  Anxiety   . Asthma   . Complication of anesthesia   . COPD (chronic obstructive pulmonary disease) ([place])    emphasema  . Depression   . GERD (gastroesophageal reflux disease)   . Hypertension   . PONV (postoperative nausea and vomiting)     SURGICAL HISTORY:      Past Surgical History:  Procedure Laterality Date  . CESAREAN SECTION  1992  . MICROLARYNGOSCOPY N/A 03/03/2017   Procedure: MICROLARYNGOSCOPY WITH BIOPSY;  Surgeon: Leta Baptist, MD;  Location: [place];  Service: ENT;  Laterality: N/A;    SOCIAL HISTORY: Social History        Social History  . Marital status: Married    Spouse name: N/A  . Number of children: N/A  . Years of education: N/A      Occupational History  . Not on file.         Social History Main Topics  . Smoking status: Current Every Day Smoker    Packs/day: 1.00    Years: 36.00    Types: Cigarettes  . Smokeless tobacco: Never Used  . Alcohol use Yes     Comment: 1-2 beers ocassionally  . Drug use: Yes    Types: Marijuana     Comment: "every once in a while"  . Sexual activity: Yes    Birth control/ protection: None       Other Topics Concern  . Not on file      Social History Narrative  . No narrative on file    FAMILY HISTORY      Family History  Problem Relation Age of Onset  . Cancer Mother        Throat Cancer, Breast Cancer    ALLERGIES: is allergic to amoxicillin; codeine; penicillins; and adhesive [tape].  MEDICATIONS:        Current Outpatient Prescriptions  Medication Sig Dispense Refill  . albuterol  (PROVENTIL HFA;VENTOLIN HFA) 108 (90 Base) MCG/ACT inhaler Inhale 2 puffs into the lungs every 6 (six) hours as needed for wheezing or shortness of breath. 3 Inhaler 1  . albuterol (PROVENTIL) (2.5 MG/3ML) 0.083% nebulizer solution Take 3 mLs (2.5 mg total) by nebulization every 6 (six) hours as needed for wheezing or shortness of breath. 150 mL 1  . ALPRAZolam (XANAX PO) Take 1 tablet by mouth as needed.     Marland Kitchen lisinopril (PRINIVIL,ZESTRIL) 10 MG tablet Take 1 tablet (10 mg total) by mouth daily. (Patient taking differently: Take 5 mg by mouth daily. ) 30 tablet 1  . oxyCODONE-acetaminophen (ROXICET) 5-325 MG tablet Take 1 tablet by mouth every 6 (six) hours as needed for severe pain. 20 tablet 0  . varenicline (CHANTIX CONTINUING MONTH PAK) 1 MG tablet Take 1 tablet (1 mg total) by mouth 2 (two) times daily. (Patient not taking: Reported on 03/11/2017) 120 tablet 0  . varenicline (CHANTIX STARTING MONTH PAK) 0.5 MG X 11 & 1 MG X 42 tablet Take one 0.5 mg tablet PO once daily for 3 days, then increase to one 0.5 mg tablet bid for 4 days, then increase to one 1 mg tablet bid (Patient not taking: Reported on 03/11/2017) 53 tablet 0   No current facility-administered medications for this visit.     PHYSICAL EXAMINATION:  ECOG PERFORMANCE STATUS: 1 - Symptomatic but completely ambulatory      Vitals:   03/11/17 1340  BP: (!) 151/92  Pulse: 65  Resp: 18  Temp: 98.3 F (36.8 C)  Filed Weights   03/11/17 1340  Weight: 72 lb (32.7 kg)     Physical Exam  Constitutional: She is oriented to person, place, and time. No distress.  Cachectic female  HENT:  Very poor dentition with dental caries and multiple teeth missing  Eyes: Pupils are equal, round, and reactive to light. Conjunctivae are normal. No scleral icterus.  Neck: Normal range of motion. Neck supple.  Cardiovascular: Normal rate and regular rhythm.  Exam reveals no gallop and no friction rub.   No murmur  heard. Pulmonary/Chest: Effort normal and breath sounds normal. No respiratory distress. She has no wheezes.  Abdominal: Soft. Bowel sounds are normal. She exhibits no distension. There is no tenderness.  Musculoskeletal: Normal range of motion. She exhibits no edema or tenderness.  Lymphadenopathy:    Cervical adenopathy: left cervical LN.  Neurological: She is oriented to person, place, and time. No cranial nerve deficit.  Skin: Skin is warm and dry. No rash noted. No erythema. No pallor.  Psychiatric: Memory, affect and judgment normal.  Patient became tearful when she was talking about her diagnosis     LABORATORY DATA: I have personally reviewed the data as listed:         No visits with results within 1 Month(s) from this visit.  Latest known visit with results is:  Hospital Outpatient Visit on 01/21/2017  Component Date Value Ref Range Status  . Cholesterol 01/21/2017 171  0 - 200 mg/dL Final  . Triglycerides 01/21/2017 55  <150 mg/dL Final  . HDL 01/21/2017 65  >40 mg/dL Final  . Total CHOL/HDL Ratio 01/21/2017 2.6  RATIO Final  . VLDL 01/21/2017 11  0 - 40 mg/dL Final  . LDL Cholesterol 01/21/2017 95  0 - 99 mg/dL Final   Comment:        Total Cholesterol/HDL:CHD Risk Coronary Heart Disease Risk Table                     Men   Women  1/2 Average Risk   3.4   3.3  Average Risk       5.0   4.4  2 X Average Risk   9.6   7.1  3 X Average Risk  23.4   11.0        Use the calculated Patient Ratio above and the CHD Risk Table to determine the patient's CHD Risk.        ATP III CLASSIFICATION (LDL):  <100     mg/dL   Optimal  100-129  mg/dL   Near or Above                    Optimal  130-159  mg/dL   Borderline  160-189  mg/dL   High  >190     mg/dL   Very High   . Sodium 01/21/2017 140  135 - 145 mmol/L Final  . Potassium 01/21/2017 3.9  3.5 - 5.1 mmol/L Final  . Chloride 01/21/2017 104  101 - 111 mmol/L Final  . CO2 01/21/2017 29  22 - 32 mmol/L Final  .  Glucose, Bld 01/21/2017 104* 65 - 99 mg/dL Final  . BUN 01/21/2017 15  6 - 20 mg/dL Final  . Creatinine, Ser 01/21/2017 0.51  0.44 - 1.00 mg/dL Final  . Calcium 01/21/2017 9.3  8.9 - 10.3 mg/dL Final  . Total Protein 01/21/2017 7.2  6.5 - 8.1 g/dL Final  . Albumin 01/21/2017 4.1  3.5 - 5.0  g/dL Final  . AST 01/21/2017 35  15 - 41 U/L Final  . ALT 01/21/2017 22  14 - 54 U/L Final  . Alkaline Phosphatase 01/21/2017 51  38 - 126 U/L Final  . Total Bilirubin 01/21/2017 0.6  0.3 - 1.2 mg/dL Final  . GFR calc non Af Amer 01/21/2017 >60  >60 mL/min Final  . GFR calc Af Amer 01/21/2017 >60  >60 mL/min Final   Comment: (NOTE) The eGFR has been calculated using the CKD EPI equation. This calculation has not been validated in all clinical situations. eGFR's persistently <60 mL/min signify possible Chronic Kidney Disease.   . Anion gap 01/21/2017 7  5 - 15 Final  . WBC 01/21/2017 4.5  4.0 - 10.5 K/uL Final  . RBC 01/21/2017 4.21  3.87 - 5.11 MIL/uL Final  . Hemoglobin 01/21/2017 14.1  12.0 - 15.0 g/dL Final  . HCT 01/21/2017 41.7  36.0 - 46.0 % Final  . MCV 01/21/2017 99.0  78.0 - 100.0 fL Final  . MCH 01/21/2017 33.5  26.0 - 34.0 pg Final  . MCHC 01/21/2017 33.8  30.0 - 36.0 g/dL Final  . RDW 01/21/2017 13.3  11.5 - 15.5 % Final  . Platelets 01/21/2017 242  150 - 400 K/uL Final  . Neutrophils Relative % 01/21/2017 77  % Final  . Neutro Abs 01/21/2017 3.5  1.7 - 7.7 K/uL Final  . Lymphocytes Relative 01/21/2017 13  % Final  . Lymphs Abs 01/21/2017 0.6* 0.7 - 4.0 K/uL Final  . Monocytes Relative 01/21/2017 9  % Final  . Monocytes Absolute 01/21/2017 0.4  0.1 - 1.0 K/uL Final  . Eosinophils Relative 01/21/2017 1  % Final  . Eosinophils Absolute 01/21/2017 0.0  0.0 - 0.7 K/uL Final  . Basophils Relative 01/21/2017 0  % Final  . Basophils Absolute 01/21/2017 0.0  0.0 - 0.1 K/uL Final    RADIOGRAPHIC STUDIES: I have personally reviewed the radiological images as listed and agree with the  findings in the report  No results found.  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  I have ordered a staging PET CT.  Based on her final staging with PET CT, we will see if she will need induction chemo vs proceeding with concurrent systemic chemo with RT. She will need to see radiation-oncology in [place] since she is currently uninsured for evaluation regarding radiation. I believe patient will have poor tolerability to chemotherapy. She is very cachectic and does not appear to have much reserve.  We will get social work involved to see if they can offer her any form of assistance since she does not have any transportation.  RTC in 2 weeks for follow up.       Orders Placed This Encounter  Procedures  . NM PET Image Initial (PI) Skull Base To Thigh    Standing Status:   Future    Standing Expiration Date:   03/11/2018    Order Specific Question:   Reason for Exam (SYMPTOM  OR DIAGNOSIS REQUIRED)    Answer:   staging scans for new vocal cord cancer    Order Specific Question:   If indicated for the ordered procedure, I authorize the administration of a radiopharmaceutical per Radiology protocol    Answer:   Yes    Order Specific Question:   Is the patient pregnant?    Answer:   No    Order Specific Question:   Preferred imaging location?  Answer:   [facility]    Order Specific Question:   Radiology Contrast Protocol - do NOT remove file path    Answer:   [email or web address]  . IR Fluoro Guide CV Line Left    stat    Standing Status:   Future    Standing Expiration Date:   05/11/2018    Order Specific Question:   Reason for Exam (SYMPTOM  OR DIAGNOSIS REQUIRED)    Answer:   chemoport    Order Specific Question:   Is the patient pregnant?    Answer:   No    Order Specific Question:   Preferred Imaging Location?    Answer:    [facility]  . Ambulatory referral to Social Work    Referral Priority:   Routine    Referral Type:   Consultation    Referral Reason:   Specialty Services Required    Number of Visits Requested:   1    All questions were answered. The patient knows to call the clinic with any problems, questions or concerns.  This note was electronically signed.   Twana First, MD  03/12/2017 12:49 PM    Electronically signed by Twana First, MD at 03/12/2017 1:13 PM

## 2017-03-26 NOTE — Discharge Instructions (Signed)

## 2017-03-26 NOTE — Anesthesia Postprocedure Evaluation (Signed)
Anesthesia Post Note  Patient: Jamie Salinas  Procedure(s) Performed: Procedure(s) (LRB): Extraction of tooth #'s 2,5-8, 20-23, 6 and 29 with alveoloplasty (N/A)     Patient location during evaluation: PACU Anesthesia Type: General Level of consciousness: awake and alert Pain management: pain level controlled Vital Signs Assessment: post-procedure vital signs reviewed and stable Respiratory status: spontaneous breathing, nonlabored ventilation, respiratory function stable and patient connected to nasal cannula oxygen Cardiovascular status: stable Postop Assessment: no signs of nausea or vomiting Anesthetic complications: no    Last Vitals:  Vitals:   03/26/17 1015 03/26/17 1033  BP: 98/66 101/60  Pulse: 72 80  Resp: 20 16  Temp: 36.5 C 36.5 C  SpO2: 93% 96%    Last Pain:  Vitals:   03/26/17 1400  TempSrc:   PainSc: 2                  Biviana Saddler

## 2017-03-26 NOTE — Anesthesia Preprocedure Evaluation (Addendum)
Anesthesia Evaluation  Patient identified by MRN, date of birth, ID band Patient awake    Reviewed: Allergy & Precautions, NPO status , Patient's Chart, lab work & pertinent test results  History of Anesthesia Complications (+) PONV and history of anesthetic complications  Airway Mallampati: II  TM Distance: >3 FB Neck ROM: Full    Dental  (+) Poor Dentition   Pulmonary COPD,  COPD inhaler, Current Smoker,    + rhonchi        Cardiovascular hypertension, Pt. on medications  Rhythm:Regular     Neuro/Psych PSYCHIATRIC DISORDERS Anxiety Depression  Neuromuscular disease    GI/Hepatic Neg liver ROS, GERD  ,  Endo/Other  negative endocrine ROS  Renal/GU negative Renal ROS     Musculoskeletal   Abdominal   Peds  Hematology negative hematology ROS (+)   Anesthesia Other Findings   Reproductive/Obstetrics                             Anesthesia Physical Anesthesia Plan  ASA: II  Anesthesia Plan: General   Post-op Pain Management:    Induction: Intravenous  PONV Risk Score and Plan: 3 and Ondansetron, Dexamethasone, Midazolam and Scopolamine patch - Pre-op  Airway Management Planned: Oral ETT  Additional Equipment:   Intra-op Plan:   Post-operative Plan: Extubation in OR  Informed Consent: I have reviewed the patients History and Physical, chart, labs and discussed the procedure including the risks, benefits and alternatives for the proposed anesthesia with the patient or authorized representative who has indicated his/her understanding and acceptance.   Dental advisory given  Plan Discussed with: CRNA and Surgeon  Anesthesia Plan Comments:         Anesthesia Quick Evaluation

## 2017-03-26 NOTE — Progress Notes (Signed)
PRE-OPERATIVE NOTE:  03/26/2017 Jamie Salinas [id number]  VITALS: BP 110/60   Pulse 85   Temp 98 F (36.7 C) (Oral)   Resp 18   Ht 4\' 11"  (1.499 m)   Wt 71 lb (32.2 kg)   LMP 03/09/2012   SpO2 100%   BMI 14.34 kg/m   Lab Results  Component Value Date   WBC 4.2 03/17/2017   HGB 12.7 03/17/2017   HCT 37.9 03/17/2017   MCV 98.7 03/17/2017   PLT 249 03/17/2017   BMET    Component Value Date/Time   NA 142 03/17/2017 0933   K 4.2 03/17/2017 0933   CL 108 03/17/2017 0933   CO2 28 03/17/2017 0933   GLUCOSE 90 03/17/2017 0933   BUN 10 03/17/2017 0933   CREATININE 0.47 03/17/2017 0933   CALCIUM 9.1 03/17/2017 0933   GFRNONAA >60 03/17/2017 0933   GFRAA >60 03/17/2017 0933    Lab Results  Component Value Date   INR 0.90 03/17/2017   INR 1.0 05/18/2008   No results found for: PTT   Jamie Salinas presents for Multiple dental extractions with alveoloplasty in the operating room with general anesthesia.   SUBJECTIVE: The patient denies any acute medical or dental changes and agrees to proceed with treatment as planned.  EXAM: No sign of acute dental changes.  ASSESSMENT: Patient is affected by chronic apical periodontitis, dental caries, chronic periodontitis, and loose teeth. Patient also has some dental phobia.  PLAN: Patient agrees to proceed with treatment as planned in the operating room as previously discussed and accepts the risks, benefits, and complications of the proposed treatment. Patient is aware of the risk for bleeding, bruising, swelling, infection, pain, nerve damage, soft tissue damage, sinus involvement, root tip fracture, mandible fracture, and the risks of complications associated with the anesthesia. Patient also is aware of the potential for other complications up to and including death due to overall medical compromise and and risk for respiratory and breathing complications.    Lenn Cal, DDS

## 2017-03-30 DIAGNOSIS — C4442 Squamous cell carcinoma of skin of scalp and neck: Secondary | ICD-10-CM | POA: Insufficient documentation

## 2017-04-06 ENCOUNTER — Encounter (HOSPITAL_COMMUNITY): Payer: Self-pay | Admitting: Dentistry

## 2017-04-06 ENCOUNTER — Ambulatory Visit (HOSPITAL_COMMUNITY): Payer: Self-pay | Admitting: Dentistry

## 2017-04-06 VITALS — BP 120/67 | HR 66 | Temp 98.2°F

## 2017-04-06 DIAGNOSIS — Z01818 Encounter for other preprocedural examination: Secondary | ICD-10-CM

## 2017-04-06 DIAGNOSIS — K08109 Complete loss of teeth, unspecified cause, unspecified class: Secondary | ICD-10-CM

## 2017-04-06 DIAGNOSIS — K0820 Unspecified atrophy of edentulous alveolar ridge: Secondary | ICD-10-CM

## 2017-04-06 DIAGNOSIS — K08199 Complete loss of teeth due to other specified cause, unspecified class: Secondary | ICD-10-CM

## 2017-04-06 DIAGNOSIS — C321 Malignant neoplasm of supraglottis: Secondary | ICD-10-CM

## 2017-04-06 NOTE — Progress Notes (Signed)
POST OPERATIVE NOTE:  04/06/2017 Jamie Salinas [id number]  VITALS: BP 120/67 (BP Location: Left Arm)   Pulse 66   Temp 98.2 F (36.8 C) (Oral)   LMP 03/09/2012    LABS:  Lab Results  Component Value Date   WBC 4.2 03/17/2017   HGB 12.7 03/17/2017   HCT 37.9 03/17/2017   MCV 98.7 03/17/2017   PLT 249 03/17/2017   BMET    Component Value Date/Time   NA 142 03/17/2017 0933   K 4.2 03/17/2017 0933   CL 108 03/17/2017 0933   CO2 28 03/17/2017 0933   GLUCOSE 90 03/17/2017 0933   BUN 10 03/17/2017 0933   CREATININE 0.47 03/17/2017 0933   CALCIUM 9.1 03/17/2017 0933   GFRNONAA >60 03/17/2017 0933   GFRAA >60 03/17/2017 0933    Lab Results  Component Value Date   INR 0.90 03/17/2017   INR 1.0 05/18/2008   No results found for: PTT   Jamie Salinas is status post extraction of remaining teeth with alveoloplasty in the operating room on 03/26/2017. The patient now presents for evaluation of healing and suture removal. Patient has since had a feeding tube placed and recent admission with discharge on Friday, 04/03/17.  SUBJECTIVE: Patient with minimal discomfort from dental extraction sites. Stitches are still present.  EXAM: There is no sign of infection, heme, or ooze. Sutures are loosely intact. Patient is edentulous. 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  PROCEDURE: The patient was given a chlorhexidine gluconate rinse for 30 seconds. Sutures were then removed without complication. Patient tolerated the procedure well.  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 Patient has feeding tube in place.  PLAN: 1. Continue salt water rinses as needed aid healing. Brush tongue daily. 2. Advance diet as tolerated and continue use of feeding tube as needed. 3. Suggest continued  nutritional support from nutritionist/dietitian at [facility]. 4. Patient to call for appointment will evaluation during chemoradiation therapy in 2-3 weeks as needed, otherwise patient is to follow the dental medicine one month after the last radiation therapy dose. 5. No denture fabrication to be started until three months after the last radiation therapy dose with the primary dentist of her choice.  Lenn Cal, DDS

## 2017-04-06 NOTE — Patient Instructions (Addendum)
PLAN: 1. Continue salt water rinses as needed aid healing. Brush tongue daily. 2. Advance diet as tolerated and continue use of feeding tube as needed. 3. Suggest continued nutritional support from nutritionist/dietitian at [facility]. 4. Patient to call for appointment will evaluation during chemoradiation therapy in 2-3 weeks as needed, otherwise patient is to follow the dental medicine one month after the last radiation therapy dose. 5. No denture fabrication to be started until three months after the last radiation therapy dose with the primary dentist of her choice.  Lenn Cal, DDS

## 2017-04-21 ENCOUNTER — Ambulatory Visit: Payer: Self-pay | Admitting: Physician Assistant

## 2017-04-27 ENCOUNTER — Encounter: Payer: Self-pay | Admitting: Physician Assistant

## 2017-05-19 ENCOUNTER — Ambulatory Visit: Payer: Self-pay | Admitting: Physician Assistant

## 2017-05-19 ENCOUNTER — Encounter: Payer: Self-pay | Admitting: Physician Assistant

## 2017-05-19 VITALS — BP 82/60 | HR 77 | Temp 97.9°F | Wt <= 1120 oz

## 2017-05-19 DIAGNOSIS — J449 Chronic obstructive pulmonary disease, unspecified: Secondary | ICD-10-CM

## 2017-05-19 DIAGNOSIS — C320 Malignant neoplasm of glottis: Secondary | ICD-10-CM

## 2017-05-19 DIAGNOSIS — F17219 Nicotine dependence, cigarettes, with unspecified nicotine-induced disorders: Secondary | ICD-10-CM

## 2017-05-19 DIAGNOSIS — R636 Underweight: Secondary | ICD-10-CM

## 2017-05-19 NOTE — Progress Notes (Signed)
BP (!) 82/60 (BP Location: Left Arm, Patient Position: Sitting, Cuff Size: Normal)   Pulse 77   Temp 97.9 F (36.6 C)   Wt 70 lb (31.8 kg)   LMP 03/09/2012   SpO2 94%   BMI 14.14 kg/m    Subjective:    Patient ID: Jamie Salinas, female    DOB: March 09, 1966, 51 y.o.   MRN: [id number]  HPI: Jamie Salinas is a 51 y.o. female presenting on 05/19/2017 for No chief complaint on file.   HPI    Pt thinks the cancer center people got her signed up for medicaid over the phone.  She says she hasn't heard anything.  She doesn't know if she has medicaid or not but thinks she does.   Pt has been taking 1/2 lisinopril daily as discussed at last OV.   She is getting her cancer care at [facility], not at [facility] in [place]. She says that was arranged due to transportation problems.   Pt is still smoking about 4 cigarettes daily.  She says she is using the patch to try to help stop.    She says she is doing okay.  She has feeding tube and is taking some PO.    Relevant past medical, surgical, family and social history reviewed and updated as indicated. Interim medical history since our last visit reviewed. Allergies and medications reviewed and updated.   Current Outpatient Prescriptions:  .  albuterol (PROVENTIL HFA;VENTOLIN HFA) 108 (90 Base) MCG/ACT inhaler, Inhale 2 puffs into the lungs every 6 (six) hours as needed for wheezing or shortness of breath. (Patient taking differently: Inhale 2 puffs into the lungs every 4 (four) hours as needed for wheezing or shortness of breath. ), Disp: 3 Inhaler, Rfl: 1 .  albuterol (PROVENTIL) (2.5 MG/3ML) 0.083% nebulizer solution, Take 3 mLs (2.5 mg total) by nebulization every 6 (six) hours as needed for wheezing or shortness of breath., Disp: 150 mL, Rfl: 1 .  ALPRAZolam (XANAX) 1 MG tablet, Take 0.5 mg by mouth at bedtime. , Disp: , Rfl:  .  lactose free nutrition (BOOST) LIQD, Take 237 mLs by mouth 3 (three) times daily between meals., Disp: ,  Rfl:  .  lisinopril (PRINIVIL,ZESTRIL) 10 MG tablet, Take 1 tablet (10 mg total) by mouth daily., Disp: 30 tablet, Rfl: 1 .  neomycin-bacitracin-polymyxin (NEOSPORIN) ointment, Apply 1 application topically as needed for wound care. apply to left arm burn prn, Disp: , Rfl:  .  oxyCODONE-acetaminophen (PERCOCET) 5-325 MG tablet, Take one-half to one tablet by mouth every 6 hours as needed for moderate to severe pain., Disp: 20 tablet, Rfl: 0  Review of Systems  Constitutional: Positive for unexpected weight change. Negative for appetite change, chills, diaphoresis, fatigue and fever.  HENT: Positive for sneezing, sore throat and trouble swallowing. Negative for congestion, drooling, ear pain, facial swelling, hearing loss, mouth sores and voice change.   Eyes: Negative for pain, discharge, redness, itching and visual disturbance.  Respiratory: Negative for cough, choking, shortness of breath and wheezing.   Cardiovascular: Negative for chest pain, palpitations and leg swelling.  Gastrointestinal: Negative for abdominal pain, blood in stool, constipation, diarrhea and vomiting.  Endocrine: Negative for cold intolerance, heat intolerance and polydipsia.  Genitourinary: Negative for decreased urine volume, dysuria and hematuria.  Musculoskeletal: Negative for arthralgias, back pain and gait problem.  Skin: Negative for rash.  Allergic/Immunologic: Negative for environmental allergies.  Neurological: Negative for seizures, syncope, light-headedness and headaches.  Hematological: Negative for adenopathy.  Psychiatric/Behavioral: Negative for agitation, dysphoric mood and suicidal ideas. The patient is not nervous/anxious.     Per HPI unless specifically indicated above     Objective:    BP (!) 82/60 (BP Location: Left Arm, Patient Position: Sitting, Cuff Size: Normal)   Pulse 77   Temp 97.9 F (36.6 C)   Wt 70 lb (31.8 kg)   LMP 03/09/2012   SpO2 94%   BMI 14.14 kg/m   Wt Readings from  Last 3 Encounters:  05/19/17 70 lb (31.8 kg)  03/26/17 71 lb (32.2 kg)  03/24/17 72 lb 12 oz (33 kg)    Physical Exam  Constitutional: She is oriented to person, place, and time. She appears cachectic. She is cooperative. She has a sickly appearance.  HENT:  Head: Normocephalic and atraumatic.  Neck: Neck supple.  Cardiovascular: Normal rate and regular rhythm.   Pulmonary/Chest: Effort normal and breath sounds normal. No respiratory distress.  Abdominal: Soft. Bowel sounds are normal. There is no tenderness.  Neurological: She is alert and oriented to person, place, and time.  Skin: Skin is warm and dry.  Psychiatric: She has a normal mood and affect. Her behavior is normal.  Nursing note and vitals reviewed.       Assessment & Plan:   Encounter Diagnoses  Name Primary?  . Chronic obstructive pulmonary disease, unspecified COPD type ([place]) Yes  . Cigarette nicotine dependence with nicotine-induced disorder   . 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 Underweight      -discontinue lisinopril. -encouraged pt to find out if she was approved for medicaid -encouraged smoking cessation -pt to follow up 3 months.  RTO sooner prn

## 2017-07-16 ENCOUNTER — Other Ambulatory Visit (HOSPITAL_COMMUNITY): Payer: Self-pay | Admitting: Radiation Oncology

## 2017-07-16 DIAGNOSIS — C321 Malignant neoplasm of supraglottis: Secondary | ICD-10-CM

## 2017-07-30 ENCOUNTER — Other Ambulatory Visit: Payer: Self-pay | Admitting: Physician Assistant

## 2017-07-30 MED ORDER — ALBUTEROL SULFATE HFA 108 (90 BASE) MCG/ACT IN AERS: 2.0000 | INHALATION_SPRAY | RESPIRATORY_TRACT | 0 refills | Status: DC | PRN

## 2017-08-18 ENCOUNTER — Ambulatory Visit: Payer: Self-pay | Admitting: Physician Assistant

## 2017-08-20 ENCOUNTER — Ambulatory Visit (INDEPENDENT_AMBULATORY_CARE_PROVIDER_SITE_OTHER): Payer: Medicaid Other | Admitting: Otolaryngology

## 2017-08-20 DIAGNOSIS — F17210 Nicotine dependence, cigarettes, uncomplicated: Secondary | ICD-10-CM | POA: Diagnosis not present

## 2017-08-20 DIAGNOSIS — Z8521 Personal history of malignant neoplasm of larynx: Secondary | ICD-10-CM

## 2017-09-07 ENCOUNTER — Ambulatory Visit (HOSPITAL_COMMUNITY)
Admission: RE | Admit: 2017-09-07 | Discharge: 2017-09-07 | Disposition: A | Discharge status: Home / Self Care | Payer: Medicaid Other | Source: Ambulatory Visit | Attending: Radiation Oncology | Admitting: Radiation Oncology

## 2017-09-07 DIAGNOSIS — Z8521 Personal history of malignant neoplasm of larynx: Secondary | ICD-10-CM | POA: Insufficient documentation

## 2017-09-07 DIAGNOSIS — Z08 Encounter for follow-up examination after completed treatment for malignant neoplasm: Secondary | ICD-10-CM | POA: Insufficient documentation

## 2017-09-07 DIAGNOSIS — C321 Malignant neoplasm of supraglottis: Secondary | ICD-10-CM

## 2017-09-07 DIAGNOSIS — J439 Emphysema, unspecified: Secondary | ICD-10-CM | POA: Diagnosis not present

## 2017-09-07 LAB — GLUCOSE, CAPILLARY: Glucose-Capillary: 96 mg/dL (ref 65–99)

## 2017-09-07 MED ORDER — FLUDEOXYGLUCOSE F - 18 (FDG) INJECTION
5.0000 | Freq: Once | INTRAVENOUS | Status: AC | PRN
  Administered 2017-09-07: 5 via INTRAVENOUS

## 2017-10-15 ENCOUNTER — Telehealth: Payer: Self-pay | Admitting: Family Medicine

## 2017-10-15 NOTE — Telephone Encounter (Signed)
Pt LVM with [place] to confirm appt, I called back and left a msg with the PT that her Appt was 3-20 @ 9:40

## 2017-10-28 ENCOUNTER — Telehealth: Payer: Self-pay

## 2017-10-28 ENCOUNTER — Ambulatory Visit (INDEPENDENT_AMBULATORY_CARE_PROVIDER_SITE_OTHER): Payer: Medicaid Other | Admitting: Family Medicine

## 2017-10-28 ENCOUNTER — Other Ambulatory Visit: Payer: Self-pay

## 2017-10-28 ENCOUNTER — Encounter: Payer: Self-pay | Admitting: Family Medicine

## 2017-10-28 VITALS — BP 138/82 | HR 91 | Temp 98.2°F | Resp 16 | Ht 59.0 in | Wt 76.2 lb

## 2017-10-28 DIAGNOSIS — F419 Anxiety disorder, unspecified: Secondary | ICD-10-CM

## 2017-10-28 DIAGNOSIS — F39 Unspecified mood [affective] disorder: Secondary | ICD-10-CM

## 2017-10-28 DIAGNOSIS — Z0289 Encounter for other administrative examinations: Secondary | ICD-10-CM | POA: Diagnosis not present

## 2017-10-28 DIAGNOSIS — Z23 Encounter for immunization: Secondary | ICD-10-CM | POA: Insufficient documentation

## 2017-10-28 DIAGNOSIS — Z1231 Encounter for screening mammogram for malignant neoplasm of breast: Secondary | ICD-10-CM | POA: Diagnosis not present

## 2017-10-28 DIAGNOSIS — Z720 Tobacco use: Secondary | ICD-10-CM | POA: Insufficient documentation

## 2017-10-28 DIAGNOSIS — G4700 Insomnia, unspecified: Secondary | ICD-10-CM

## 2017-10-28 DIAGNOSIS — F4310 Post-traumatic stress disorder, unspecified: Secondary | ICD-10-CM

## 2017-10-28 DIAGNOSIS — Z1239 Encounter for other screening for malignant neoplasm of breast: Secondary | ICD-10-CM | POA: Insufficient documentation

## 2017-10-28 DIAGNOSIS — F319 Bipolar disorder, unspecified: Secondary | ICD-10-CM

## 2017-10-28 DIAGNOSIS — Z79899 Other long term (current) drug therapy: Secondary | ICD-10-CM

## 2017-10-28 DIAGNOSIS — Z1211 Encounter for screening for malignant neoplasm of colon: Secondary | ICD-10-CM

## 2017-10-28 MED ORDER — MIRTAZAPINE 15 MG PO TABS: 15.0000 mg | ORAL_TABLET | Freq: Every day | ORAL | 0 refills | Status: DC

## 2017-10-28 NOTE — BH Specialist Note (Signed)
[facility] Telephone Follow-up  MRN: [id number] NAME: Jamie Salinas Date: November 03, 2017  Start time: 4:30pm - 5:30pm Total time: 30 minutes Call number: 1/6  Reason for call today: Reason for Contact: Initial Assessment  PHQ-9 Scores:  Depression screen [facility] 2/9 11-03-17 11/03/2017  Decreased Interest 2 3  Down, Depressed, Hopeless 2 3  PHQ - 2 Score 4 6  Altered sleeping 3 3  Tired, decreased energy 2 3  Change in appetite 1 2  Feeling bad or failure about yourself  3 0  Trouble concentrating 1 3  Moving slowly or fidgety/restless 0 0  Suicidal thoughts 0 0  PHQ-9 Score 14 17  Difficult doing work/chores - Somewhat difficult   GAD-7 Scores: No flowsheet data found.  Stress Current stressors: Current Stressors: (S) Other (Comment)(Patient requests aassistance wtih medicatinon mgy ) Sleep: Sleep: Decreased, Difficulty staying asleep Appetite: Appetite: Decreased, Other (Comment)(Throat cancer ) Coping ability: Coping ability: Exhausted Patient taking medications as prescribed: Patient taking medications as prescribed: Yes  Current medications:  Outpatient Encounter Medications as of 11-03-2017  Medication Sig  . albuterol (PROVENTIL HFA;VENTOLIN HFA) 108 (90 Base) MCG/ACT inhaler Inhale 2 puffs into the lungs every 4 (four) hours as needed for wheezing or shortness of breath.  Marland Kitchen albuterol (PROVENTIL) (2.5 MG/3ML) 0.083% nebulizer solution Take 3 mLs (2.5 mg total) by nebulization every 6 (six) hours as needed for wheezing or shortness of breath.  . ALPRAZolam (XANAX) 1 MG tablet Take 0.5 mg by mouth at bedtime.   . lactose free nutrition (BOOST) LIQD Take 237 mLs by mouth 3 (three) times daily between meals.  . mirtazapine (REMERON) 15 MG tablet Take 1 tablet (15 mg total) by mouth at bedtime.   No facility-administered encounter medications on file as of 11-03-2017.      Self-harm Behaviors Risk Assessment Self-harm risk factors:   Patient endorses recent  thoughts of harming self: Have you recently had any thoughts about harming yourself?: No  [place] Suicide Severity Rating Scale: No flowsheet data found. C-SRSS 11-03-17 03-Nov-2017  1. Wish to be Dead No No  2. Suicidal Thoughts No No  6. Suicide Behavior Question No No     Danger to Others Risk Assessment Danger to others risk factors: Danger to Others Risk Factors: No risk factors noted Patient endorses recent thoughts of harming others: Notification required: No need or identified person  Dynamic Appraisal of Situational Aggression (DASA): No flowsheet data found.   Substance Use Assessment Patient recently consumed alcohol:    Alcohol Use Disorder Identification Test (AUDIT):  Alcohol Use Disorder Test (AUDIT) 05/14/2014 05/14/2014  1. How often do you have a drink containing alcohol? 1 1  2. How many drinks containing alcohol do you have on a typical day when you are drinking? 1 3  3. How often do you have six or more drinks on one occasion? 0 0  AUDIT-C Score 1 3  4. How often during the last year have you found that you were not able to stop drinking once you had started? - 0  5. How often during the last year have you failed to do what was normally expected from you becasue of drinking? - 0  6. How often during the last year have you needed a first drink in the morning to get yourself going after a heavy drinking session? - 0  7. How often during the last year have you had a feeling of guilt of remorse after drinking? - 0  8.  How often during the last year have you been unable to remember what happened the night before because you had been drinking? - 0  9. Have you or someone else been injured as a result of your drinking? - 0  10. Has a relative or friend or a doctor or another health worker been concerned about your drinking or suggested you cut down? - 0  Alcohol Use Disorder Identification Test Final Score (AUDIT) - 4   Patient recently used drugs:    Opioid Risk  Assessment:    Goals, Interventions and Follow-up Plan Goals: Increase healthy adjustment to current life circumstances Interventions: Motivational Interviewing and Supportive Counseling Follow-up Plan: Refer to Psychiatrist for Medication Management Patient has an Medication Management with Dr. Modesta Messing on 12-16-2017 and an outpatient therapy appt with Maurice Small on 11-10-2017.    Summary: Patient is a 52 year old female that requests medication management to address her anxiety. Patient reports a diagnosis of Bipolar Disorder, PTSD and Anxiety Disorder and insomnia.  Patient reports that she has not been on any medication for anxiety for a very long time.  Patient report a prior inpatient psychiatric hospitalization at [facility] five years ago due to passive suicidal ideation.   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  Patient reports that she has been living in a boarding home for the past three years.    Patient reports that she has been married for 27 years but they have been together for 29 years.    Patient reports that she was in a terrible marriage situation and kept taking her husband back after repeated infidelity.  Patient reports that he subsequently went to jail but upon beingreleased, she did accept him back. Patient reports that when he found out she had cancer he did not want anything to do with her and "left her alone at a boarding house in [place]" with no money or resources.    Patient reports that she does have a daughter that lives in [facility].  She reports that she does not get to see her as often as she would like but that her daughter comes to visit a couple times a month.  Ms. Strader has never had a license or car.  She went to 8th grade in school.  Currently she lives in a boarding house in [place].  Reports that she does receive SSI and gets food stamps.  Patient reports that she is currently taking Xanax but she cannot get this  medication through Dr. Mannie Stabile.  Patient denies any negative side effects.  Patient reports that her PCP placed her on Mirtazapine 15mg  today.  Patient reports a history of mania. Patient reports a decreased need for sleep. Patient denies euphoria. Patient denies past suicide attempts. Patient denies any past or present self-injurious behaviors. Patient reports a family history of mental illness. Patient denies a family history of substance abuse. Patient denies a family history of suicide. Patient denies DUI. Patient reports insomnia. Patient denies SI/HI. Patient denies a history of violence. Patient denies AH/VH/paranoia. Patient denies physical, sexual or emotional abuse.   Patient reports that she likes to be outside with nature.  Patient reports that she likes to swim and take long walks.  Patient reports that she has been so depressed that she has not done any of these things.    Graciella Freer [place], LCAS-A
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
## 2017-10-30 ENCOUNTER — Encounter: Payer: Self-pay | Admitting: Internal Medicine

## 2017-11-09 ENCOUNTER — Ambulatory Visit (INDEPENDENT_AMBULATORY_CARE_PROVIDER_SITE_OTHER): Payer: Medicaid Other | Admitting: Otolaryngology

## 2017-11-09 DIAGNOSIS — H9209 Otalgia, unspecified ear: Secondary | ICD-10-CM

## 2017-11-09 DIAGNOSIS — R070 Pain in throat: Secondary | ICD-10-CM | POA: Diagnosis not present

## 2017-11-10 ENCOUNTER — Ambulatory Visit (HOSPITAL_COMMUNITY): Payer: Self-pay | Admitting: Psychiatry

## 2017-11-10 ENCOUNTER — Telehealth: Payer: Self-pay | Admitting: Family Medicine

## 2017-11-10 NOTE — Telephone Encounter (Signed)
Please advise that she may discontinue the remeron. She does have an upcoming appointment with Dr. Modesta Messing, but not until May. I will send to Isla Pence at [facility] and see if they can see her sooner or make recommendations.I had discussed with her that I will not be prescribing xanax. Please confirm no suicidal or homicidal ideations. Please advise that we will be intouch when we hear back from [facility]. Gwen Her. Mannie Stabile, MD

## 2017-11-10 NOTE — Telephone Encounter (Signed)
Patient left message on nurse line asking for return call regarding Remeron pill. Patient states she is having real bad nightmares, racing thoughts, she felt like she could not function. Took for three days, has not taken any more. Was not taking any Xanax while taking. Took Xanax last night, slept fine.   Requests call back in the afternoon- she will be out in the morning.

## 2017-11-11 NOTE — Telephone Encounter (Signed)
Patient informed of message below, verbalized understanding.  

## 2017-11-11 NOTE — Telephone Encounter (Signed)
Called patient regarding message below. No answer, unable to leave message.  

## 2017-12-03 ENCOUNTER — Other Ambulatory Visit: Payer: Self-pay | Admitting: Otolaryngology

## 2017-12-10 ENCOUNTER — Encounter (HOSPITAL_BASED_OUTPATIENT_CLINIC_OR_DEPARTMENT_OTHER): Payer: Self-pay | Admitting: *Deleted

## 2017-12-16 ENCOUNTER — Ambulatory Visit (HOSPITAL_COMMUNITY): Payer: Self-pay | Admitting: Psychiatry

## 2017-12-16 ENCOUNTER — Encounter: Payer: Self-pay | Admitting: Family Medicine

## 2017-12-18 ENCOUNTER — Ambulatory Visit (HOSPITAL_COMMUNITY): Payer: Self-pay

## 2017-12-20 NOTE — Anesthesia Preprocedure Evaluation (Addendum)
Anesthesia Evaluation  Patient identified by MRN, date of birth, ID band Patient awake    Reviewed: Allergy & Precautions, NPO status , Patient's Chart, lab work & pertinent test results  History of Anesthesia Complications (+) PONV and history of anesthetic complications  Airway Mallampati: II  TM Distance: >3 FB Neck ROM: Full    Dental  (+) Poor Dentition   Pulmonary COPD,  COPD inhaler, Current Smoker,    + rhonchi        Cardiovascular hypertension, Pt. on medications  Rhythm:Regular     Neuro/Psych PSYCHIATRIC DISORDERS Anxiety Depression  Neuromuscular disease    GI/Hepatic Neg liver ROS, GERD  ,  Endo/Other  negative endocrine ROS  Renal/GU negative Renal ROS     Musculoskeletal   Abdominal   Peds  Hematology negative hematology ROS (+)   Anesthesia Other Findings All: Amoxicillin  Reproductive/Obstetrics                            Anesthesia Physical Anesthesia Plan  ASA: III  Anesthesia Plan: General   Post-op Pain Management:    Induction: Intravenous  PONV Risk Score and Plan: Treatment may vary due to age or medical condition  Airway Management Planned: Oral ETT  Additional Equipment:   Intra-op Plan:   Post-operative Plan:   Informed Consent: I have reviewed the patients History and Physical, chart, labs and discussed the procedure including the risks, benefits and alternatives for the proposed anesthesia with the patient or authorized representative who has indicated his/her understanding and acceptance.   Dental advisory given  Plan Discussed with:   Anesthesia Plan Comments:         Anesthesia Quick Evaluation

## 2017-12-21 ENCOUNTER — Encounter (HOSPITAL_BASED_OUTPATIENT_CLINIC_OR_DEPARTMENT_OTHER)
Admission: RE | Disposition: A | Discharge status: Home / Self Care | Payer: Self-pay | Source: Ambulatory Visit | Attending: Otolaryngology

## 2017-12-21 ENCOUNTER — Ambulatory Visit (HOSPITAL_BASED_OUTPATIENT_CLINIC_OR_DEPARTMENT_OTHER)
Admission: RE | Admit: 2017-12-21 | Discharge: 2017-12-21 | Disposition: A | Discharge status: Home / Self Care | Payer: Medicaid Other | Source: Ambulatory Visit | Attending: Otolaryngology | Admitting: Otolaryngology

## 2017-12-21 ENCOUNTER — Ambulatory Visit (HOSPITAL_BASED_OUTPATIENT_CLINIC_OR_DEPARTMENT_OTHER): Payer: Medicaid Other | Admitting: Anesthesiology

## 2017-12-21 ENCOUNTER — Encounter (HOSPITAL_BASED_OUTPATIENT_CLINIC_OR_DEPARTMENT_OTHER): Payer: Self-pay | Admitting: *Deleted

## 2017-12-21 DIAGNOSIS — I10 Essential (primary) hypertension: Secondary | ICD-10-CM | POA: Diagnosis not present

## 2017-12-21 DIAGNOSIS — F329 Major depressive disorder, single episode, unspecified: Secondary | ICD-10-CM | POA: Diagnosis not present

## 2017-12-21 DIAGNOSIS — K1370 Unspecified lesions of oral mucosa: Secondary | ICD-10-CM | POA: Insufficient documentation

## 2017-12-21 DIAGNOSIS — F17200 Nicotine dependence, unspecified, uncomplicated: Secondary | ICD-10-CM | POA: Insufficient documentation

## 2017-12-21 DIAGNOSIS — Z79899 Other long term (current) drug therapy: Secondary | ICD-10-CM | POA: Insufficient documentation

## 2017-12-21 DIAGNOSIS — J449 Chronic obstructive pulmonary disease, unspecified: Secondary | ICD-10-CM | POA: Insufficient documentation

## 2017-12-21 DIAGNOSIS — F419 Anxiety disorder, unspecified: Secondary | ICD-10-CM | POA: Insufficient documentation

## 2017-12-21 DIAGNOSIS — K219 Gastro-esophageal reflux disease without esophagitis: Secondary | ICD-10-CM | POA: Diagnosis not present

## 2017-12-21 DIAGNOSIS — B079 Viral wart, unspecified: Secondary | ICD-10-CM | POA: Diagnosis not present

## 2017-12-21 DIAGNOSIS — D3709 Neoplasm of uncertain behavior of other specified sites of the oral cavity: Secondary | ICD-10-CM | POA: Diagnosis not present

## 2017-12-21 DIAGNOSIS — B078 Other viral warts: Secondary | ICD-10-CM | POA: Diagnosis present

## 2017-12-21 HISTORY — PX: MASS EXCISION: SHX2000

## 2017-12-21 SURGERY — EXCISION MASS
Anesthesia: General | Site: Mouth

## 2017-12-21 MED ORDER — LIDOCAINE HCL (CARDIAC) PF 100 MG/5ML IV SOSY
PREFILLED_SYRINGE | INTRAVENOUS | Status: AC
  Filled 2017-12-21: qty 15

## 2017-12-21 MED ORDER — MIDAZOLAM HCL 2 MG/2ML IJ SOLN: 1.0000 mg | INTRAMUSCULAR | Status: DC | PRN

## 2017-12-21 MED ORDER — SUCCINYLCHOLINE CHLORIDE 200 MG/10ML IV SOSY
PREFILLED_SYRINGE | INTRAVENOUS | Status: AC
  Filled 2017-12-21: qty 10

## 2017-12-21 MED ORDER — EPHEDRINE SULFATE 50 MG/ML IJ SOLN
INTRAMUSCULAR | Status: DC | PRN
  Administered 2017-12-21: 10 mg via INTRAVENOUS

## 2017-12-21 MED ORDER — MIDAZOLAM HCL 2 MG/2ML IJ SOLN
INTRAMUSCULAR | Status: AC
  Filled 2017-12-21: qty 2

## 2017-12-21 MED ORDER — LIDOCAINE-EPINEPHRINE 1 %-1:100000 IJ SOLN
INTRAMUSCULAR | Status: DC | PRN
  Administered 2017-12-21: 1 mL

## 2017-12-21 MED ORDER — FENTANYL CITRATE (PF) 100 MCG/2ML IJ SOLN
INTRAMUSCULAR | Status: AC
  Filled 2017-12-21: qty 2

## 2017-12-21 MED ORDER — OXYCODONE-ACETAMINOPHEN 5-325 MG PO TABS: 1.0000 | ORAL_TABLET | ORAL | 0 refills | Status: DC | PRN

## 2017-12-21 MED ORDER — DEXAMETHASONE SODIUM PHOSPHATE 4 MG/ML IJ SOLN
INTRAMUSCULAR | Status: DC | PRN
  Administered 2017-12-21: 6 mg via INTRAVENOUS

## 2017-12-21 MED ORDER — PROPOFOL 10 MG/ML IV BOLUS
INTRAVENOUS | Status: DC | PRN
  Administered 2017-12-21: 70 mg via INTRAVENOUS

## 2017-12-21 MED ORDER — SUCCINYLCHOLINE CHLORIDE 20 MG/ML IJ SOLN
INTRAMUSCULAR | Status: DC | PRN
  Administered 2017-12-21: 30 mg via INTRAVENOUS

## 2017-12-21 MED ORDER — PHENYLEPHRINE HCL 10 MG/ML IJ SOLN
INTRAMUSCULAR | Status: DC | PRN
  Administered 2017-12-21: 40 ug via INTRAVENOUS

## 2017-12-21 MED ORDER — LACTATED RINGERS IV SOLN
INTRAVENOUS | Status: DC
  Administered 2017-12-21: 11:00:00 via INTRAVENOUS

## 2017-12-21 MED ORDER — FENTANYL CITRATE (PF) 100 MCG/2ML IJ SOLN
50.0000 ug | INTRAMUSCULAR | Status: DC | PRN
  Administered 2017-12-21: 50 ug via INTRAVENOUS

## 2017-12-21 MED ORDER — ONDANSETRON HCL 4 MG/2ML IJ SOLN
INTRAMUSCULAR | Status: DC | PRN
  Administered 2017-12-21: 4 mg via INTRAVENOUS

## 2017-12-21 MED ORDER — CLINDAMYCIN HCL 300 MG PO CAPS: 300.0000 mg | ORAL_CAPSULE | Freq: Three times a day (TID) | ORAL | 0 refills | Status: AC

## 2017-12-21 MED ORDER — SCOPOLAMINE 1 MG/3DAYS TD PT72: 1.0000 | MEDICATED_PATCH | Freq: Once | TRANSDERMAL | Status: DC | PRN

## 2017-12-21 MED ORDER — ONDANSETRON HCL 4 MG/2ML IJ SOLN
INTRAMUSCULAR | Status: AC
  Filled 2017-12-21: qty 6

## 2017-12-21 SURGICAL SUPPLY — 38 items
BLADE SURG 15 STRL LF DISP TIS (BLADE) ×1 IMPLANT
BLADE SURG 15 STRL SS (BLADE) ×3
CANISTER SUCT 1200ML W/VALVE (MISCELLANEOUS) ×3 IMPLANT
COVER MAYO STAND STRL (DRAPES) ×3 IMPLANT
DECANTER SPIKE VIAL GLASS SM (MISCELLANEOUS) IMPLANT
DEPRESSOR TONGUE BLADE STERILE (MISCELLANEOUS) IMPLANT
ELECT COATED BLADE 2.86 ST (ELECTRODE) IMPLANT
ELECT NDL BLADE 2-5/6 (NEEDLE) IMPLANT
ELECT NEEDLE BLADE 2-5/6 (NEEDLE) IMPLANT
ELECT REM PT RETURN 9FT ADLT (ELECTROSURGICAL) ×3
ELECT REM PT RETURN 9FT PED (ELECTROSURGICAL)
ELECTRODE REM PT RETRN 9FT PED (ELECTROSURGICAL) IMPLANT
ELECTRODE REM PT RTRN 9FT ADLT (ELECTROSURGICAL) IMPLANT
GLOVE BIO SURGEON STRL SZ 6.5 (GLOVE) ×2 IMPLANT
GLOVE BIO SURGEON STRL SZ7.5 (GLOVE) ×3 IMPLANT
GLOVE BIO SURGEONS STRL SZ 6.5 (GLOVE) ×1
GLOVE BIOGEL PI IND STRL 7.0 (GLOVE) IMPLANT
GLOVE BIOGEL PI INDICATOR 7.0 (GLOVE) ×2
GOWN STRL REUS W/ TWL LRG LVL3 (GOWN DISPOSABLE) ×1 IMPLANT
GOWN STRL REUS W/TWL LRG LVL3 (GOWN DISPOSABLE) ×3
NDL PRECISIONGLIDE 27X1.5 (NEEDLE) ×1 IMPLANT
NEEDLE PRECISIONGLIDE 27X1.5 (NEEDLE) ×3 IMPLANT
PACK BASIN DAY SURGERY FS (CUSTOM PROCEDURE TRAY) ×3 IMPLANT
PENCIL BUTTON HOLSTER BLD 10FT (ELECTRODE) ×3 IMPLANT
PENCIL FOOT CONTROL (ELECTRODE) ×1 IMPLANT
SHEET MEDIUM DRAPE 40X70 STRL (DRAPES) ×3 IMPLANT
SUCTION FRAZIER HANDLE 10FR (MISCELLANEOUS) ×2
SUCTION TUBE FRAZIER 10FR DISP (MISCELLANEOUS) IMPLANT
SUT CHROMIC 5 0 RB 1 27 (SUTURE) IMPLANT
SUT SILK 3 0 TIES 17X18 (SUTURE)
SUT SILK 3-0 18XBRD TIE BLK (SUTURE) IMPLANT
SUT VIC AB 4-0 RB1 27 (SUTURE)
SUT VIC AB 4-0 RB1 27X BRD (SUTURE) IMPLANT
SYR CONTROL 10ML LL (SYRINGE) ×3 IMPLANT
TOWEL OR 17X24 6PK STRL BLUE (TOWEL DISPOSABLE) ×3 IMPLANT
TUBE CONNECTING 20'X1/4 (TUBING) ×1
TUBE CONNECTING 20X1/4 (TUBING) ×2 IMPLANT
YANKAUER SUCT BULB TIP NO VENT (SUCTIONS) IMPLANT

## 2017-12-21 NOTE — Anesthesia Postprocedure Evaluation (Signed)
Anesthesia Post Note  Patient: [name]  Procedure(s) Performed: EXCISION OF ORAL LESION (N/A Mouth)     Patient location during evaluation: PACU Anesthesia Type: General Level of consciousness: awake and alert Pain management: pain level controlled Vital Signs Assessment: post-procedure vital signs reviewed and stable Respiratory status: spontaneous breathing, nonlabored ventilation, respiratory function stable and patient connected to nasal cannula oxygen Cardiovascular status: blood pressure returned to baseline and stable Postop Assessment: no apparent nausea or vomiting Anesthetic complications: no    Last Vitals:  Vitals:   12/21/17 1150 12/21/17 1200  BP: 128/79 (!) 144/98  Pulse: (!) 113 (!) 117  Resp: 19 16  Temp:  36.7 C  SpO2: 97% 95%    Last Pain:  Vitals:   12/21/17 1215  TempSrc:   PainSc: 0-No pain                 Barnet Glasgow

## 2017-12-21 NOTE — Transfer of Care (Signed)
Immediate Anesthesia Transfer of Care Note  Patient: Jamie Salinas  Procedure(s) Performed: EXCISION OF ORAL LESION (N/A Mouth)  Patient Location: PACU  Anesthesia Type:General  Level of Consciousness: awake, alert , oriented and patient cooperative  Airway & Oxygen Therapy: Patient Spontanous Breathing and Patient connected to face mask oxygen  Post-op Assessment: Report given to RN and Post -op Vital signs reviewed and stable  Post vital signs: Reviewed and stable  Last Vitals:  Vitals Value Taken Time  BP 144/88 12/21/2017 11:42 AM  Temp    Pulse 116 12/21/2017 11:43 AM  Resp 20 12/21/2017 11:43 AM  SpO2 100 % 12/21/2017 11:43 AM  Vitals shown include unvalidated device data.  Last Pain:  Vitals:   12/21/17 0927  TempSrc: Oral  PainSc: 5          Complications: No apparent anesthesia complications

## 2017-12-21 NOTE — Discharge Instructions (Addendum)
The patient may resume all her previous activities and diet.  She will follow-up in my [place] office in 1 week.  Post Anesthesia Home Care Instructions  Activity: Get plenty of rest for the remainder of the day. A responsible individual must stay with you for 24 hours following the procedure.  For the next 24 hours, DO NOT: -Drive a car -Paediatric nurse -Drink alcoholic beverages -Take any medication unless instructed by your physician -Make any legal decisions or sign important papers.  Meals: Start with liquid foods such as gelatin or soup. Progress to regular foods as tolerated. Avoid greasy, spicy, heavy foods. If nausea and/or vomiting occur, drink only clear liquids until the nausea and/or vomiting subsides. Call your physician if vomiting continues.  Special Instructions/Symptoms: Your throat may feel dry or sore from the anesthesia or the breathing tube placed in your throat during surgery. If this causes discomfort, gargle with warm salt water. The discomfort should disappear within 24 hours.  If you had a scopolamine patch placed behind your ear for the management of post- operative nausea and/or vomiting:  1. The medication in the patch is effective for 72 hours, after which it should be removed.  Wrap patch in a tissue and discard in the trash. Wash hands thoroughly with soap and water. 2. You may remove the patch earlier than 72 hours if you experience unpleasant side effects which may include dry mouth, dizziness or visual disturbances. 3. Avoid touching the patch. Wash your hands with soap and water after contact with the patch.

## 2017-12-21 NOTE — H&P (Signed)
Cc: Lesion on palate  HPI: The patient is a 52 year old female who returns today for her follow-up evaluation. 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 Her tumor involved the right aryepiglottic fold, extending down to the right vocal cord. The patient was treated with chemoradiation. She completed her treatment on 06/13/2017. According to the patient, her oral intake has significantly improved. She denies any significant dysphagia, odynophagia or dyspnea. The patient has recently noted some throat irritation and ear pain. She feels this may be related to her recent post nasal drainage. Since the patient's oral intake has improved, she would like to have her dentures fitted. However, she has a growth on her hard palate that needs to be removed. The patient stopped smoking one week ago. No other ENT, GI, or respiratory issue noted since the last visit.   Exam: General: Communicates without difficulty, thin and cachetic, no acute distress. Head: Normocephalic, no evidence injury, no tenderness, facial buttresses intact without stepoff. Face/sinus: No tenderness to palpation and percussion. Facial movement is normal and symmetric. Eyes: PERRL, EOMI. No scleral icterus, conjunctivae clear. Neuro: CN II exam reveals vision grossly intact. No nystagmus at any point of gaze. Ears: Auricles well formed without lesions. Ear canals are intact without mass or lesion. No erythema or edema is appreciated. The TMs are intact without fluid. Nose: External evaluation reveals normal support and skin without lesions. Dorsum is intact. Anterior rhinoscopy reveals healthy pink mucosa over anterior aspect of inferior turbinates and intact septum. No purulence noted. Oral:  Oral cavity and oropharynx are intact, symmetric, without erythema or edema. A verrucous lesion noted on the hard palate. Neck: Full range of motion without pain. There is no significant lymphadenopathy. No masses  palpable. Thyroid bed within normal limits to palpation. Parotid glands and submandibular glands equal bilaterally without mass. Trachea is midline. Neuro:  CN 2-12 grossly intact. Gait normal.   Procedure:  Flexible Fiberoptic Laryngoscopy Risks, benefits, and alternatives of flexible endoscopy were explained to the patient. Specific mention was made of the risk of throat numbness with difficulty swallowing, possible bleeding from the nose and mouth, and pain from the procedure. The patient gave oral consent to proceed. The nasal cavities were decongested and anesthetised with a combination of oxymetazoline and 4% lidocaine solution. The flexible scope was inserted into the right nasal cavity and advanced towards the nasopharynx. Visualized mucosa over the turbinates and septum were as described above. The nasopharynx was clear. Oropharyngeal walls were symmetric and mobile without lesion, mass, or edema. Hypopharynx was also without  lesion or edema. Diffuse laryngeal edema. Vocal cords mobile without mass. Base of tongue was within normal limits.   Assessment 1.  No recurrent tumor is noted on today's laryngoscopy examination.  2.  The patient's larynx is diffusely edematous, likely secondary to the effect of radiation.  3.  No airway obstruction.  4.  Post nasal drip. 5.  A 71mm lesion noted along the hard palate with the appearance suggestive of a papilloma.   Plan  1. The laryngoscopy findings are reviewed with the patient.  2. Continue with tobacco cessation.  3. Excision of her hard palate lesion is discussed. The risks, benefits, alternatives, and details of the procedure are reviewed with the patient. Questions are invited and answered.  4. The patient would like to proceed with the procedure.

## 2017-12-21 NOTE — Anesthesia Procedure Notes (Signed)
Procedure Name: Intubation Date/Time: 12/21/2017 11:11 AM Performed by: Signe Colt, CRNA Pre-anesthesia Checklist: Patient identified, Emergency Drugs available, Suction available and Patient being monitored Patient Re-evaluated:Patient Re-evaluated prior to induction Oxygen Delivery Method: Circle system utilized Preoxygenation: Pre-oxygenation with 100% oxygen Induction Type: IV induction Ventilation: Mask ventilation without difficulty Laryngoscope Size: Mac and 3 Grade View: Grade I Tube type: Oral Tube size: 7.0 mm Number of attempts: 1 Airway Equipment and Method: Stylet and Oral airway Placement Confirmation: ETT inserted through vocal cords under direct vision,  positive ETCO2 and breath sounds checked- equal and bilateral Secured at: 21 cm Tube secured with: Tape Dental Injury: Teeth and Oropharynx as per pre-operative assessment

## 2017-12-21 NOTE — Op Note (Signed)
DATE OF PROCEDURE:  12/21/2017                              OPERATIVE REPORT  SURGEON:  Leta Baptist, MD  PREOPERATIVE DIAGNOSES: 1. Hard palate mass  POSTOPERATIVE DIAGNOSES: 1. Hard palate mass  PROCEDURE PERFORMED:  Excision of a 1cm hard palate lesion (CPT [phone])  ANESTHESIA:  General endotracheal tube anesthesia.  COMPLICATIONS:  None.  ESTIMATED BLOOD LOSS:  Minimal.  INDICATION FOR PROCEDURE:  Jamie Salinas is a 51 y.o. female with a history of a hard palate mass.  The hard palate mass has prevented the patient from obtaining her upper denture.  She is interested in having the mass excised. The risks, benefits, alternatives, and details of the procedure were discussed with the patient.  Questions were invited and answered.  Informed consent was obtained.  DESCRIPTION:  The patient was taken to the operating room and placed supine on the operating table.  General endotracheal tube anesthesia was administered by the anesthesiologist.  The patient was positioned and prepped and draped in a standard fashion for oral surgery.   Examination of the patient's oral cavity revealed a soft tissue mass on the left side of her hard palate.  1% lidocaine with 1-100,000 epinephrine was infiltrated around the oral lesion.  A 1cm elliptical incision was made around the lesion.  The entire lesion and the submucosal tissue were resected and sent to the pathology department for permanent histologic identification.  Hemostasis was achieved with Bovie electrocautery device.  The surgical site was copiously irrigated.  The care of the patient was turned over to the anesthesiologist.  The patient was awakened from anesthesia without difficulty.  The patient was extubated and transferred to the recovery room in good condition.  OPERATIVE FINDINGS:  A hard palate mass.  The appearance is suggestive of a papilloma.  SPECIMEN:  Hard palate mass.  FOLLOWUP CARE:  The patient will be discharged home once awake  and alert.   Tanairy Payeur W Deuce Paternoster 12/21/2017 11:49 AM

## 2017-12-22 ENCOUNTER — Encounter (HOSPITAL_BASED_OUTPATIENT_CLINIC_OR_DEPARTMENT_OTHER): Payer: Self-pay | Admitting: Otolaryngology

## 2017-12-28 ENCOUNTER — Ambulatory Visit (INDEPENDENT_AMBULATORY_CARE_PROVIDER_SITE_OTHER): Payer: Medicaid Other | Admitting: Otolaryngology
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
## 2018-01-07 ENCOUNTER — Ambulatory Visit (HOSPITAL_COMMUNITY): Payer: Self-pay | Admitting: Psychiatry

## 2018-01-13 ENCOUNTER — Ambulatory Visit: Payer: Medicaid Other | Admitting: Gastroenterology

## 2018-01-14 ENCOUNTER — Encounter: Payer: Self-pay | Admitting: Family Medicine

## 2018-01-15 ENCOUNTER — Encounter: Payer: Self-pay | Admitting: Family Medicine

## 2018-01-15 ENCOUNTER — Telehealth: Payer: Self-pay | Admitting: Family Medicine

## 2018-01-15 NOTE — Telephone Encounter (Signed)
Patient lvm requesting a call back to make an appt. I called patient back, no answer, lvm with appt details (already scheduled.)

## 2018-01-19 ENCOUNTER — Telehealth: Payer: Self-pay | Admitting: Family Medicine

## 2018-01-19 NOTE — Telephone Encounter (Signed)
Patient lvm stating she may not make her appt tomorrow, I called her back to r/s but she didn't answer & her voicemail didn't beep to leave a message.

## 2018-01-20 ENCOUNTER — Encounter: Payer: Self-pay | Admitting: Family Medicine

## 2018-01-20 ENCOUNTER — Other Ambulatory Visit: Payer: Self-pay

## 2018-01-20 ENCOUNTER — Ambulatory Visit (INDEPENDENT_AMBULATORY_CARE_PROVIDER_SITE_OTHER): Payer: Medicaid Other | Admitting: Family Medicine

## 2018-01-20 ENCOUNTER — Ambulatory Visit (HOSPITAL_COMMUNITY): Payer: Self-pay | Admitting: Psychiatry

## 2018-01-20 VITALS — BP 98/78 | HR 97 | Temp 98.9°F | Resp 14 | Ht 59.0 in | Wt 71.0 lb

## 2018-01-20 DIAGNOSIS — C320 Malignant neoplasm of glottis: Secondary | ICD-10-CM | POA: Diagnosis not present

## 2018-01-20 DIAGNOSIS — F319 Bipolar disorder, unspecified: Secondary | ICD-10-CM

## 2018-01-20 DIAGNOSIS — F4310 Post-traumatic stress disorder, unspecified: Secondary | ICD-10-CM

## 2018-01-20 DIAGNOSIS — R634 Abnormal weight loss: Secondary | ICD-10-CM | POA: Diagnosis not present

## 2018-01-20 DIAGNOSIS — G4700 Insomnia, unspecified: Secondary | ICD-10-CM

## 2018-01-20 DIAGNOSIS — F419 Anxiety disorder, unspecified: Secondary | ICD-10-CM | POA: Diagnosis not present

## 2018-01-20 MED ORDER — ALPRAZOLAM 0.5 MG PO TABS: 0.5000 mg | ORAL_TABLET | Freq: Two times a day (BID) | ORAL | 0 refills | Status: DC | PRN

## 2018-01-20 MED ORDER — PAROXETINE HCL 10 MG PO TABS: 10.0000 mg | ORAL_TABLET | Freq: Every day | ORAL | 0 refills | Status: DC
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
## 2018-01-21 ENCOUNTER — Ambulatory Visit (INDEPENDENT_AMBULATORY_CARE_PROVIDER_SITE_OTHER): Payer: Medicaid Other | Admitting: Otolaryngology

## 2018-01-21 DIAGNOSIS — R490 Dysphonia: Secondary | ICD-10-CM

## 2018-01-21 DIAGNOSIS — R1312 Dysphagia, oropharyngeal phase: Secondary | ICD-10-CM | POA: Diagnosis not present

## 2018-01-28 ENCOUNTER — Ambulatory Visit (INDEPENDENT_AMBULATORY_CARE_PROVIDER_SITE_OTHER): Payer: Medicaid Other | Admitting: Otolaryngology

## 2018-01-28 DIAGNOSIS — R1312 Dysphagia, oropharyngeal phase: Secondary | ICD-10-CM

## 2018-01-28 DIAGNOSIS — R220 Localized swelling, mass and lump, head: Secondary | ICD-10-CM | POA: Diagnosis not present

## 2018-02-02 ENCOUNTER — Telehealth: Payer: Self-pay

## 2018-02-02 NOTE — Telephone Encounter (Signed)
[facility] - left message and 01-28-2018 and 02-02-2018

## 2018-02-03 ENCOUNTER — Encounter: Payer: Self-pay | Admitting: Family Medicine

## 2018-02-03 ENCOUNTER — Other Ambulatory Visit: Payer: Self-pay

## 2018-02-03 ENCOUNTER — Ambulatory Visit (INDEPENDENT_AMBULATORY_CARE_PROVIDER_SITE_OTHER): Payer: Medicaid Other | Admitting: Family Medicine

## 2018-02-03 VITALS — BP 144/98 | HR 62 | Temp 98.4°F | Resp 16 | Ht 59.0 in | Wt 72.0 lb

## 2018-02-03 DIAGNOSIS — F4310 Post-traumatic stress disorder, unspecified: Secondary | ICD-10-CM

## 2018-02-03 DIAGNOSIS — F322 Major depressive disorder, single episode, severe without psychotic features: Secondary | ICD-10-CM

## 2018-02-03 MED ORDER — PAROXETINE HCL 20 MG PO TABS: 20.0000 mg | ORAL_TABLET | Freq: Every day | ORAL | 1 refills | Status: DC
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
## 2018-02-18 ENCOUNTER — Telehealth: Payer: Self-pay

## 2018-02-18 NOTE — Telephone Encounter (Signed)
Patient no showed for her appt with Dr. Modesta Messing on 01/07/2018.  Patient cancelled her appt with the therapist on 01/20/2018.  Writer has made several attempts to contact the patient without success.  Patient will be placed on the inactive list. If the patient requires services again please submit another referral in the [facility] pool.   Writer will route this information to the PCP and Dr. Modesta Messing.

## 2018-02-23 ENCOUNTER — Telehealth: Payer: Self-pay

## 2018-02-23 ENCOUNTER — Encounter: Payer: Self-pay | Admitting: Family Medicine

## 2018-02-23 ENCOUNTER — Other Ambulatory Visit: Payer: Self-pay

## 2018-02-23 ENCOUNTER — Ambulatory Visit (INDEPENDENT_AMBULATORY_CARE_PROVIDER_SITE_OTHER): Payer: Medicaid Other | Admitting: Family Medicine

## 2018-02-23 ENCOUNTER — Encounter (INDEPENDENT_AMBULATORY_CARE_PROVIDER_SITE_OTHER): Payer: Self-pay

## 2018-02-23 VITALS — BP 138/90 | HR 80 | Temp 98.8°F | Resp 20 | Ht 59.0 in | Wt 71.0 lb

## 2018-02-23 DIAGNOSIS — F39 Unspecified mood [affective] disorder: Secondary | ICD-10-CM | POA: Diagnosis not present

## 2018-02-23 DIAGNOSIS — F319 Bipolar disorder, unspecified: Secondary | ICD-10-CM

## 2018-02-23 DIAGNOSIS — F419 Anxiety disorder, unspecified: Secondary | ICD-10-CM

## 2018-02-23 DIAGNOSIS — F4310 Post-traumatic stress disorder, unspecified: Secondary | ICD-10-CM

## 2018-02-23 MED ORDER — ALPRAZOLAM 0.5 MG PO TABS: 0.5000 mg | ORAL_TABLET | Freq: Two times a day (BID) | ORAL | 0 refills | Status: DC | PRN
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
## 2018-02-23 NOTE — BH Specialist Note (Signed)
re

## 2018-02-24 NOTE — Progress Notes (Signed)
Will make referral to psych given patient preference.

## 2018-03-03 ENCOUNTER — Other Ambulatory Visit (HOSPITAL_COMMUNITY): Payer: Self-pay | Admitting: Oncology

## 2018-03-03 DIAGNOSIS — C321 Malignant neoplasm of supraglottis: Secondary | ICD-10-CM

## 2018-03-05 ENCOUNTER — Telehealth: Payer: Self-pay

## 2018-03-05 DIAGNOSIS — F489 Nonpsychotic mental disorder, unspecified: Secondary | ICD-10-CM

## 2018-03-09 ENCOUNTER — Encounter (HOSPITAL_COMMUNITY): Payer: Medicaid Other

## 2018-03-15 ENCOUNTER — Ambulatory Visit (HOSPITAL_COMMUNITY): Payer: Medicaid Other

## 2018-03-18 ENCOUNTER — Other Ambulatory Visit: Payer: Self-pay

## 2018-03-18 ENCOUNTER — Encounter: Payer: Self-pay | Admitting: Family Medicine

## 2018-03-18 ENCOUNTER — Ambulatory Visit (INDEPENDENT_AMBULATORY_CARE_PROVIDER_SITE_OTHER): Payer: Medicaid Other | Admitting: Family Medicine

## 2018-03-18 VITALS — BP 138/80 | HR 75 | Temp 98.7°F | Resp 12 | Ht 59.0 in | Wt 74.1 lb

## 2018-03-18 DIAGNOSIS — F419 Anxiety disorder, unspecified: Secondary | ICD-10-CM | POA: Diagnosis not present

## 2018-03-18 DIAGNOSIS — F4310 Post-traumatic stress disorder, unspecified: Secondary | ICD-10-CM

## 2018-03-18 MED ORDER — ALPRAZOLAM 0.5 MG PO TABS: 0.5000 mg | ORAL_TABLET | Freq: Two times a day (BID) | ORAL | 0 refills | Status: DC | PRN

## 2018-03-18 MED ORDER — PAROXETINE HCL 30 MG PO TABS
30.0000 mg | ORAL_TABLET | Freq: Every day | ORAL | 0 refills | Status: DC
Start: 2018-03-18 — End: 2018-04-15
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
## 2018-03-24 ENCOUNTER — Encounter (HOSPITAL_COMMUNITY): Payer: Medicaid Other

## 2018-04-15 ENCOUNTER — Other Ambulatory Visit: Payer: Self-pay

## 2018-04-15 ENCOUNTER — Ambulatory Visit (INDEPENDENT_AMBULATORY_CARE_PROVIDER_SITE_OTHER): Payer: Medicaid Other | Admitting: Family Medicine

## 2018-04-15 ENCOUNTER — Encounter: Payer: Self-pay | Admitting: Family Medicine

## 2018-04-15 ENCOUNTER — Ambulatory Visit (INDEPENDENT_AMBULATORY_CARE_PROVIDER_SITE_OTHER): Payer: Self-pay | Admitting: Otolaryngology

## 2018-04-15 VITALS — BP 142/94 | HR 83 | Temp 98.8°F | Resp 14 | Ht 59.0 in | Wt 74.1 lb

## 2018-04-15 DIAGNOSIS — F322 Major depressive disorder, single episode, severe without psychotic features: Secondary | ICD-10-CM

## 2018-04-15 DIAGNOSIS — F4310 Post-traumatic stress disorder, unspecified: Secondary | ICD-10-CM | POA: Diagnosis not present

## 2018-04-15 DIAGNOSIS — J439 Emphysema, unspecified: Secondary | ICD-10-CM

## 2018-04-15 DIAGNOSIS — Z23 Encounter for immunization: Secondary | ICD-10-CM

## 2018-04-15 DIAGNOSIS — F419 Anxiety disorder, unspecified: Secondary | ICD-10-CM

## 2018-04-15 MED ORDER — ALPRAZOLAM 0.5 MG PO TABS: 0.5000 mg | ORAL_TABLET | Freq: Two times a day (BID) | ORAL | 0 refills | Status: DC | PRN

## 2018-04-15 MED ORDER — PAROXETINE HCL 30 MG PO TABS: 30.0000 mg | ORAL_TABLET | Freq: Every day | ORAL | 1 refills | Status: DC

## 2018-04-15 MED ORDER — ALBUTEROL SULFATE HFA 108 (90 BASE) MCG/ACT IN AERS
2.0000 | INHALATION_SPRAY | RESPIRATORY_TRACT | 3 refills | Status: AC | PRN
Start: 2018-04-15 — End: ?
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
## 2018-04-26 ENCOUNTER — Encounter (HOSPITAL_COMMUNITY): Payer: Self-pay | Admitting: Psychiatry

## 2018-04-26 ENCOUNTER — Ambulatory Visit (INDEPENDENT_AMBULATORY_CARE_PROVIDER_SITE_OTHER): Payer: Medicaid Other | Admitting: Psychiatry

## 2018-04-26 VITALS — BP 124/78 | HR 96 | Ht 59.0 in | Wt 74.0 lb

## 2018-04-26 DIAGNOSIS — F331 Major depressive disorder, recurrent, moderate: Secondary | ICD-10-CM

## 2018-04-26 DIAGNOSIS — Z818 Family history of other mental and behavioral disorders: Secondary | ICD-10-CM

## 2018-04-26 DIAGNOSIS — Z811 Family history of alcohol abuse and dependence: Secondary | ICD-10-CM

## 2018-04-26 DIAGNOSIS — G4700 Insomnia, unspecified: Secondary | ICD-10-CM

## 2018-04-26 DIAGNOSIS — F419 Anxiety disorder, unspecified: Secondary | ICD-10-CM

## 2018-04-26 DIAGNOSIS — F17210 Nicotine dependence, cigarettes, uncomplicated: Secondary | ICD-10-CM

## 2018-04-26 DIAGNOSIS — R4584 Anhedonia: Secondary | ICD-10-CM | POA: Diagnosis not present

## 2018-04-26 DIAGNOSIS — F410 Panic disorder [episodic paroxysmal anxiety] without agoraphobia: Secondary | ICD-10-CM

## 2018-04-26 NOTE — Patient Instructions (Signed)
1 Continue Paroxetine 30 mg daily  2. Continue Xanax 0.5 mg twice a day as needed for anxiety  3. Referral to therapy  4. Return to clinic in one month for 30 mins

## 2018-05-10 ENCOUNTER — Other Ambulatory Visit (HOSPITAL_COMMUNITY): Payer: Self-pay | Admitting: Oncology

## 2018-05-10 DIAGNOSIS — C760 Malignant neoplasm of head, face and neck: Secondary | ICD-10-CM

## 2018-05-19 ENCOUNTER — Ambulatory Visit (HOSPITAL_COMMUNITY): Payer: Medicaid Other

## 2018-05-19 ENCOUNTER — Encounter (HOSPITAL_COMMUNITY): Payer: Self-pay
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
## 2018-05-28 ENCOUNTER — Encounter (HOSPITAL_COMMUNITY): Payer: Self-pay | Admitting: Psychiatry

## 2018-05-28 ENCOUNTER — Ambulatory Visit (INDEPENDENT_AMBULATORY_CARE_PROVIDER_SITE_OTHER): Payer: Medicaid Other | Admitting: Psychiatry

## 2018-05-28 DIAGNOSIS — F419 Anxiety disorder, unspecified: Secondary | ICD-10-CM

## 2018-05-28 DIAGNOSIS — F4310 Post-traumatic stress disorder, unspecified: Secondary | ICD-10-CM | POA: Diagnosis not present

## 2018-05-28 MED ORDER — PAROXETINE HCL 30 MG PO TABS: 30.0000 mg | ORAL_TABLET | Freq: Every day | ORAL | 0 refills | Status: DC

## 2018-05-28 MED ORDER — ALPRAZOLAM 0.5 MG PO TABS: 0.5000 mg | ORAL_TABLET | Freq: Two times a day (BID) | ORAL | 2 refills | Status: DC | PRN

## 2018-05-28 NOTE — Patient Instructions (Signed)
1 Continue Paroxetine 30 mg daily  2. Continue Xanax 0.5 mg twice a day as needed for anxiety  3. Referral to therapy  4. Return to clinic in three months for 30 mins

## 2018-06-03 ENCOUNTER — Ambulatory Visit (INDEPENDENT_AMBULATORY_CARE_PROVIDER_SITE_OTHER): Payer: Self-pay | Admitting: Otolaryngology

## 2018-08-16 ENCOUNTER — Other Ambulatory Visit (HOSPITAL_COMMUNITY): Payer: Self-pay | Admitting: Psychiatry

## 2018-08-16 DIAGNOSIS — F419 Anxiety disorder, unspecified: Secondary | ICD-10-CM

## 2018-08-16 DIAGNOSIS — F4310 Post-traumatic stress disorder, unspecified: Secondary | ICD-10-CM

## 2018-08-16 MED ORDER — ALPRAZOLAM 0.5 MG PO TABS: 0.5000 mg | ORAL_TABLET | Freq: Two times a day (BID) | ORAL | 0 refills | Status: DC | PRN

## 2018-08-23 ENCOUNTER — Ambulatory Visit (INDEPENDENT_AMBULATORY_CARE_PROVIDER_SITE_OTHER): Payer: Self-pay | Admitting: Otolaryngology

## 2018-08-30 ENCOUNTER — Ambulatory Visit (HOSPITAL_COMMUNITY): Payer: Medicaid Other | Admitting: Psychiatry

## 2018-09-16 ENCOUNTER — Ambulatory Visit (INDEPENDENT_AMBULATORY_CARE_PROVIDER_SITE_OTHER): Payer: Self-pay | Admitting: Otolaryngology
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
## 2018-09-21 ENCOUNTER — Ambulatory Visit (INDEPENDENT_AMBULATORY_CARE_PROVIDER_SITE_OTHER): Payer: Medicaid Other | Admitting: Psychiatry

## 2018-09-21 ENCOUNTER — Encounter (HOSPITAL_COMMUNITY): Payer: Self-pay | Admitting: Psychiatry

## 2018-09-21 VITALS — BP 121/71 | HR 75 | Ht 59.0 in | Wt 78.0 lb

## 2018-09-21 DIAGNOSIS — F419 Anxiety disorder, unspecified: Secondary | ICD-10-CM | POA: Diagnosis not present

## 2018-09-21 DIAGNOSIS — F4310 Post-traumatic stress disorder, unspecified: Secondary | ICD-10-CM | POA: Diagnosis not present

## 2018-09-21 DIAGNOSIS — F3341 Major depressive disorder, recurrent, in partial remission: Secondary | ICD-10-CM

## 2018-09-21 MED ORDER — PAROXETINE HCL 30 MG PO TABS: 30.0000 mg | ORAL_TABLET | Freq: Every day | ORAL | 0 refills | Status: DC

## 2018-09-21 MED ORDER — ALPRAZOLAM 0.5 MG PO TABS: 0.5000 mg | ORAL_TABLET | Freq: Two times a day (BID) | ORAL | 2 refills | Status: DC | PRN

## 2018-09-21 NOTE — Patient Instructions (Signed)
1 Continue Paroxetine 30 mg daily  2. Continue Xanax 0.5 mg twice a day as needed for anxiety  3. Return to clinic in three months for 15 mins

## 2018-12-03 IMAGING — CT NM PET TUM IMG INITIAL (PI) SKULL BASE T - THIGH
10 series · 25 of 25 positions shown · non-contrast
Comparison: None.

CLINICAL DATA: Initial treatment strategy for recent diagnosis of
vocal cord tumor. Biopsy of right area epiglottic fold..

EXAM:
NUCLEAR MEDICINE PET SKULL BASE TO THIGH
TECHNIQUE: 12.96 mCi F-18 FDG was injected intravenously. Full-ring PET imaging
was performed from the skull base to thigh after the radiotracer. CT
data was obtained and used for attenuation correction and anatomic
localization.
FASTING BLOOD GLUCOSE:  Value: 89 mg/dl

[Series 3: pet wb (ac) · axial · 5.0mm · 2.55mm/px · z∈[-1370,-620]mm · 3 of 251 slices shown]
[im 1/251]
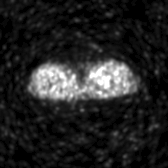
[im 126/251]
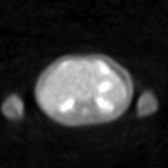
[im 251/251]
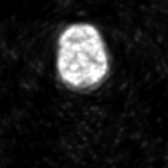

[Series 4: ct wb 5.0 b30f · axial · 5.0mm · 0.98mm/px · z∈[-1370,-620]mm · 3 of 251 slices shown]
[im 1/251]
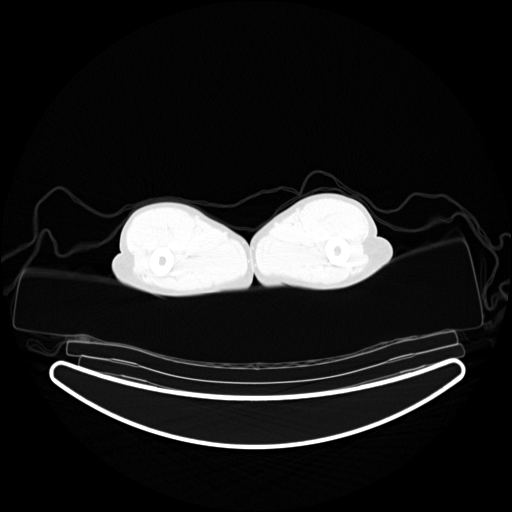
[im 126/251]
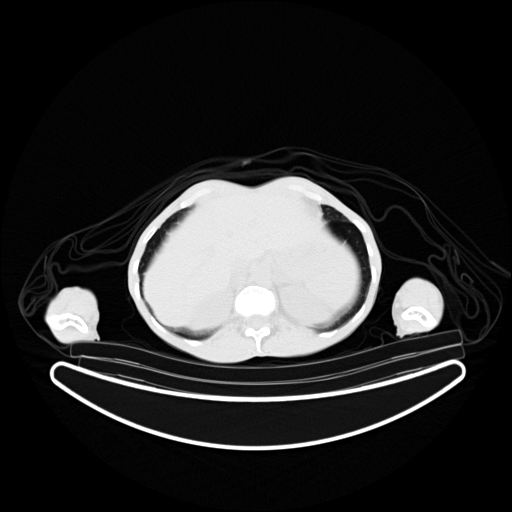
[im 251/251  brain]
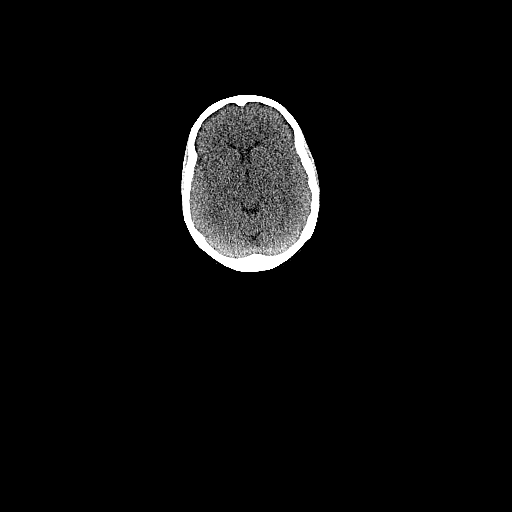

[Series 5: pet wb uncorrected (nac) · axial · 5.0mm · 4.07mm/px · z∈[-1370,-620]mm · 4 of 251 slices shown]
[im 1/251]
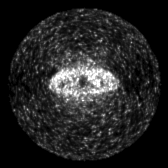
[im 84/251]
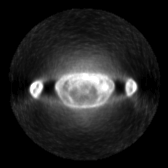
[im 167/251]
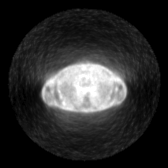
[im 251/251]
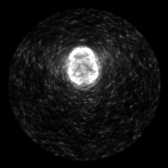

[Series 603: pet/ct axial · 4 of 249 slices shown]
[im 1/249]
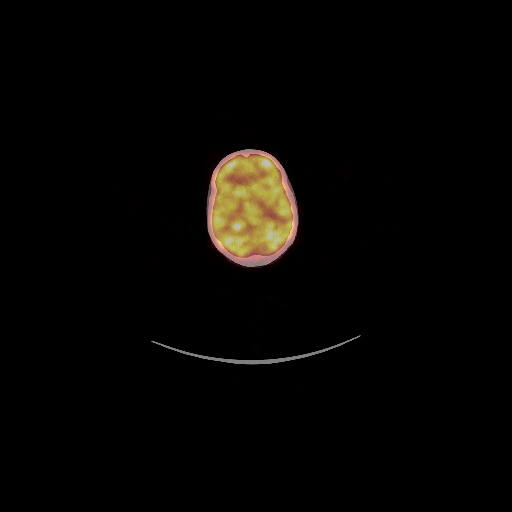
[im 83/249]
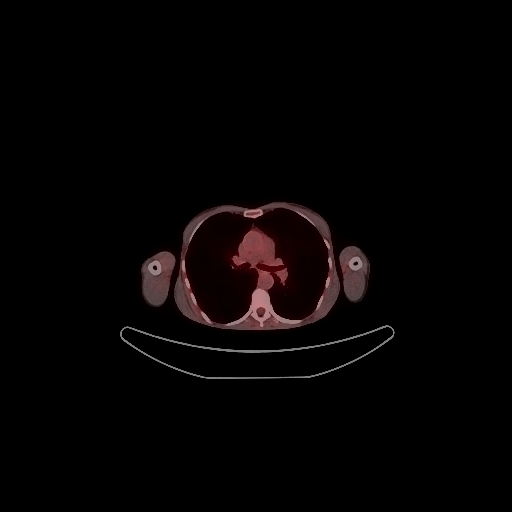
[im 166/249]
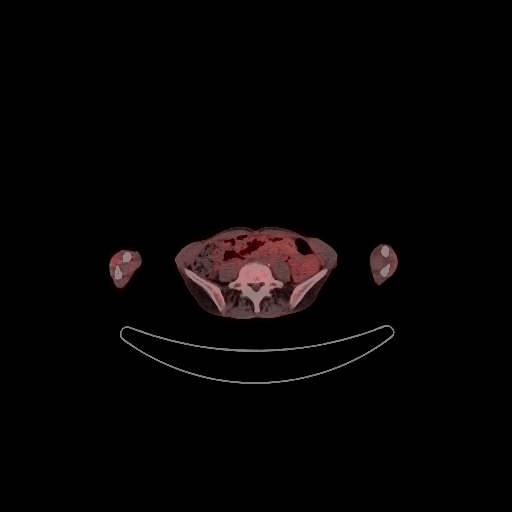
[im 249/249]
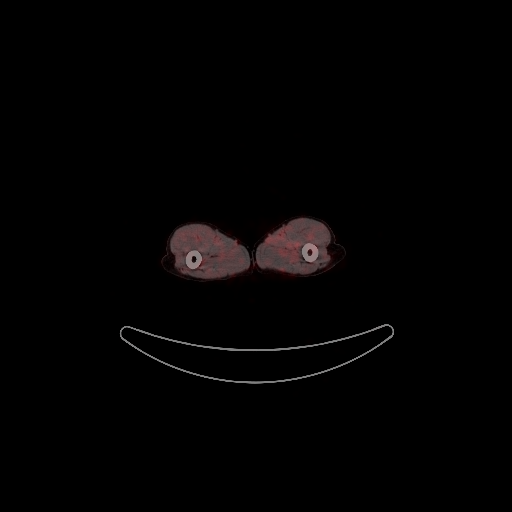

[Series 604: pet/ct coronal · 1 of 54 slices shown]
[im 1/54]
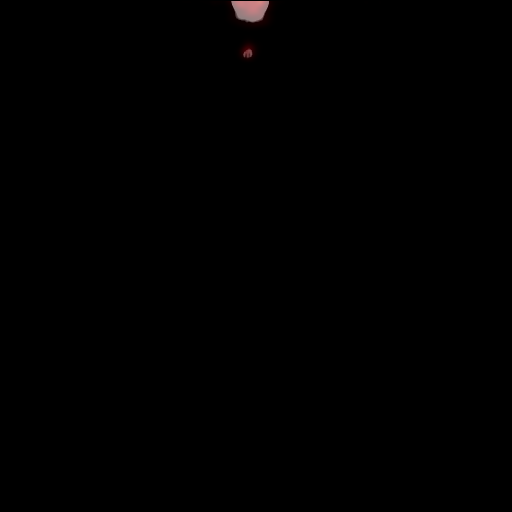

[Series 605: pet/ct sagittal · 2 of 136 slices shown]
[im 1/136]
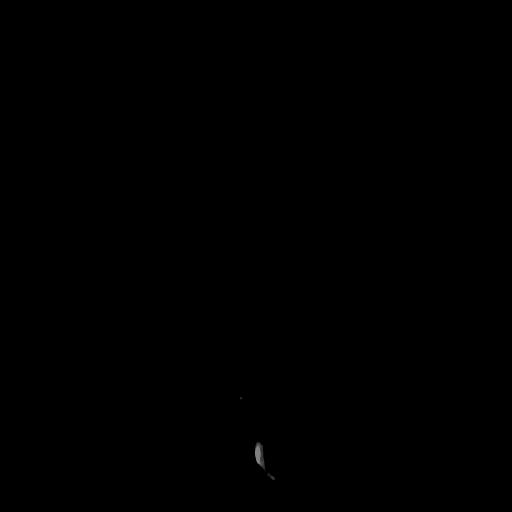
[im 136/136]
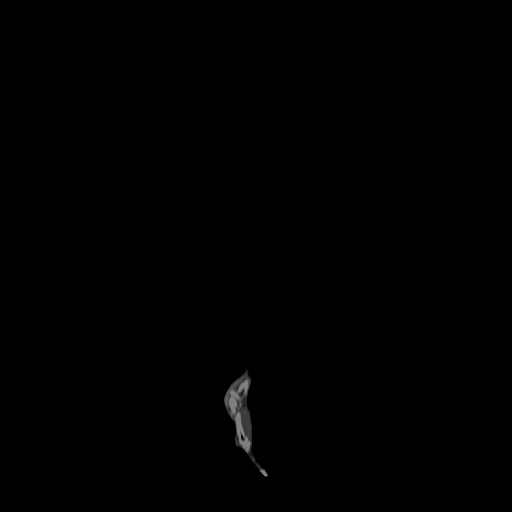

[Series 606: pet axial · 4 of 251 slices shown]
[im 1/251]
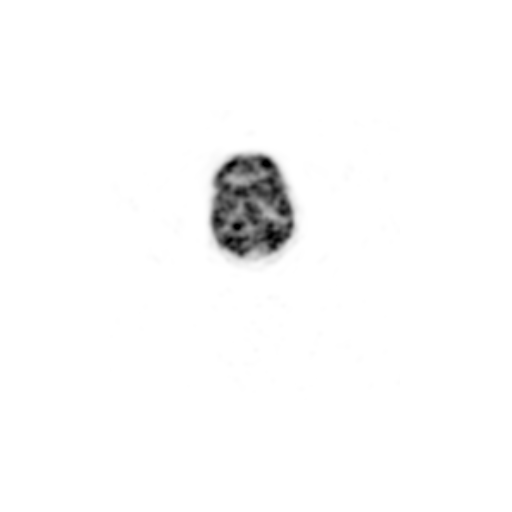
[im 84/251]
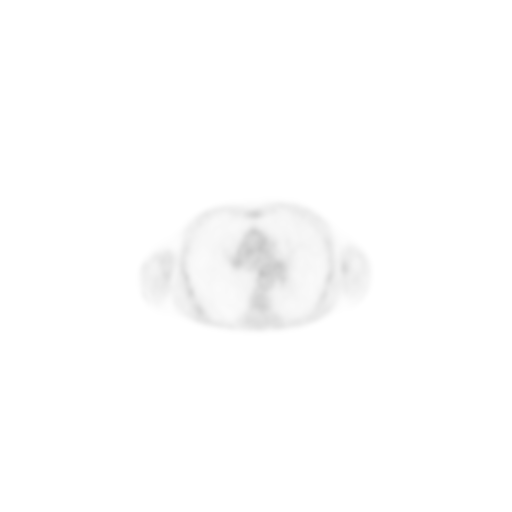
[im 167/251]
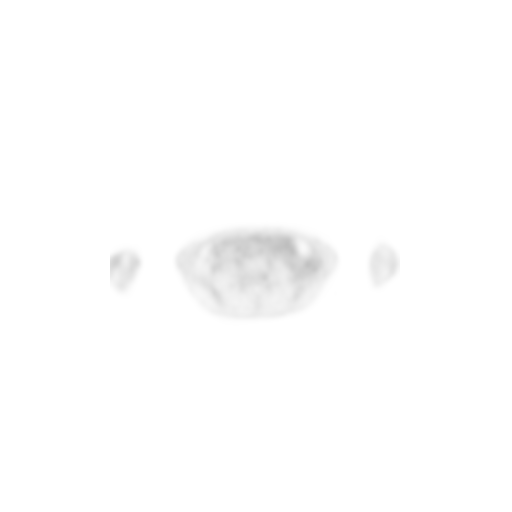
[im 251/251]
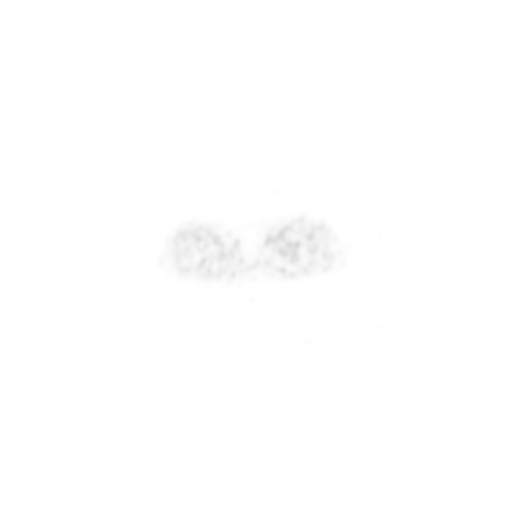

[Series 607: pet coronal · 1 of 89 slices shown]
[im 1/89]
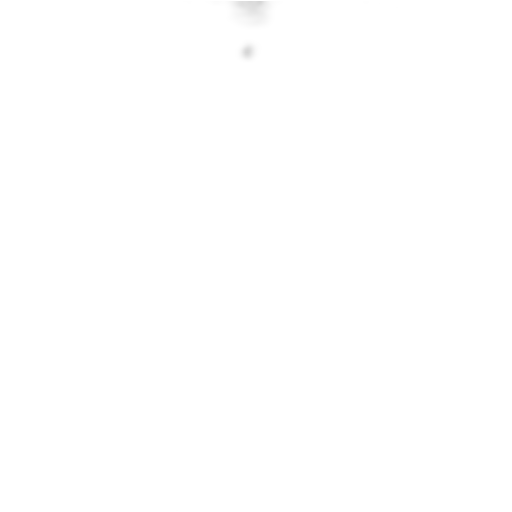

[Series 608: pet sagittal · 2 of 138 slices shown]
[im 1/138]
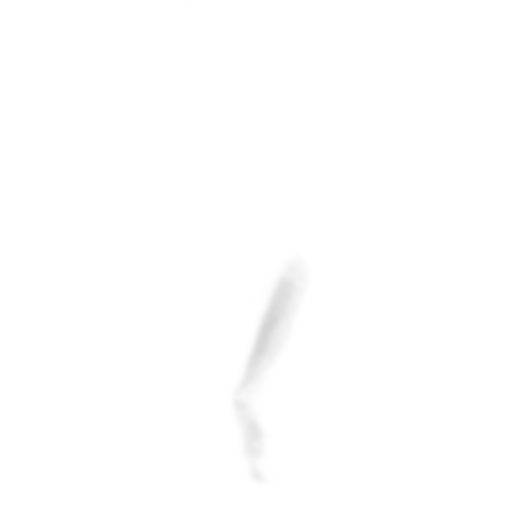
[im 138/138]
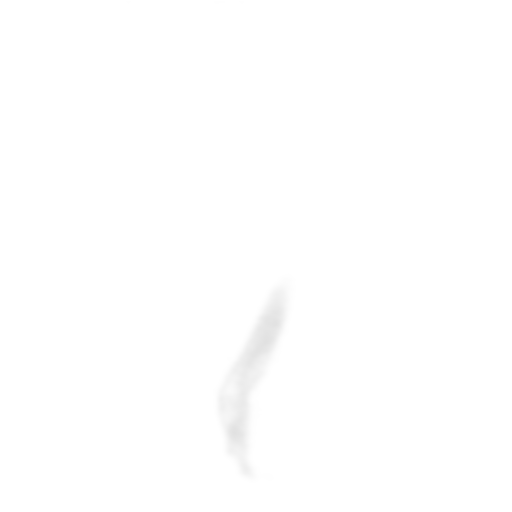

[Series 1038: results mm oncology reading · 0.65mm/px · 1 of 2 slices shown]
[im 1/2]
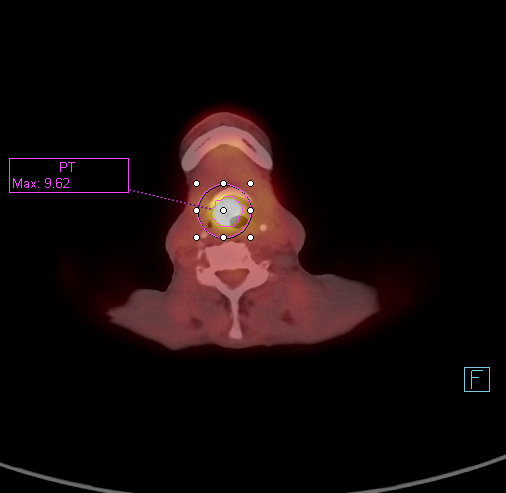

[25 of 25 positions shown; findings below may reference images not displayed]

FINDINGS: NECK

There is a hypermetabolic cervical lymph node on the left inferior
to the hyoid bone which measures approximately 8 mm short axis on
image 44. This has an SUV max of 8.6. There is extensive
hypermetabolic tumor involving the right side of the glottis and
aryepiglottic fold. There is anterior extension across the
midline.No other lesions of the pharyngeal mucosal space are seen.
There is no other hypermetabolic cervical adenopathy.

CHEST

There are no hypermetabolic mediastinal, hilar or axillary lymph
nodes. No suspicious pulmonary activity. Lung windows demonstrate
moderate centrilobular and paraseptal emphysema. There are no
suspicious pulmonary nodules. There is atherosclerosis of the aorta,
great vessels and coronary arteries.

ABDOMEN/PELVIS

There is no hypermetabolic activity within the liver, adrenal
glands, spleen or pancreas. There is no hypermetabolic nodal
activity. Mild aortoiliac atherosclerosis noted.

SKELETON

There is no hypermetabolic activity to suggest osseous metastatic
disease.
IMPRESSION: 1. Hypermetabolic right glottic malignancy with single
hypermetabolic level 3 node on the left.
2. No distant metastases.
3. No evidence of incidental neoplasm in the chest, abdomen or
pelvis.
4. Moderate centrilobular and paraseptal emphysema.
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
## 2018-12-20 ENCOUNTER — Ambulatory Visit (INDEPENDENT_AMBULATORY_CARE_PROVIDER_SITE_OTHER): Payer: Medicaid Other | Admitting: Psychiatry

## 2018-12-20 ENCOUNTER — Encounter (HOSPITAL_COMMUNITY): Payer: Self-pay | Admitting: Psychiatry

## 2018-12-20 ENCOUNTER — Other Ambulatory Visit: Payer: Self-pay

## 2018-12-20 DIAGNOSIS — F419 Anxiety disorder, unspecified: Secondary | ICD-10-CM

## 2018-12-20 DIAGNOSIS — F330 Major depressive disorder, recurrent, mild: Secondary | ICD-10-CM | POA: Diagnosis not present

## 2018-12-20 DIAGNOSIS — F4310 Post-traumatic stress disorder, unspecified: Secondary | ICD-10-CM | POA: Diagnosis not present

## 2018-12-20 MED ORDER — ALPRAZOLAM 0.5 MG PO TABS: 0.5000 mg | ORAL_TABLET | Freq: Two times a day (BID) | ORAL | 1 refills | Status: DC | PRN

## 2018-12-20 MED ORDER — SERTRALINE HCL 50 MG PO TABS: ORAL_TABLET | ORAL | 1 refills | Status: DC

## 2018-12-20 NOTE — Patient Instructions (Signed)
1  Hold paroxetine 2. Start sertraline 25 mg at night for one week, then 50 mg at night  3. Continue Xanax 0.5 mg twice a day as needed for anxiety  4. Next appointment: 7/10  8:40

## 2019-02-08 ENCOUNTER — Other Ambulatory Visit (HOSPITAL_COMMUNITY): Payer: Self-pay | Admitting: Psychiatry

## 2019-02-08 DIAGNOSIS — F4310 Post-traumatic stress disorder, unspecified: Secondary | ICD-10-CM

## 2019-02-08 DIAGNOSIS — F419 Anxiety disorder, unspecified: Secondary | ICD-10-CM

## 2019-02-08 MED ORDER — ALPRAZOLAM 0.5 MG PO TABS: 0.5000 mg | ORAL_TABLET | Freq: Two times a day (BID) | ORAL | 0 refills | Status: DC | PRN
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
## 2019-02-18 ENCOUNTER — Other Ambulatory Visit: Payer: Self-pay

## 2019-02-18 ENCOUNTER — Encounter (HOSPITAL_COMMUNITY): Payer: Self-pay | Admitting: Psychiatry

## 2019-02-18 ENCOUNTER — Ambulatory Visit (INDEPENDENT_AMBULATORY_CARE_PROVIDER_SITE_OTHER): Payer: Medicaid Other | Admitting: Psychiatry

## 2019-02-18 DIAGNOSIS — F3341 Major depressive disorder, recurrent, in partial remission: Secondary | ICD-10-CM | POA: Diagnosis not present

## 2019-02-18 MED ORDER — ALPRAZOLAM 0.5 MG PO TABS: 0.5000 mg | ORAL_TABLET | Freq: Two times a day (BID) | ORAL | 1 refills | Status: DC | PRN

## 2019-02-18 MED ORDER — SERTRALINE HCL 50 MG PO TABS: 50.0000 mg | ORAL_TABLET | Freq: Every day | ORAL | 0 refills | Status: DC

## 2019-02-18 NOTE — Patient Instructions (Signed)
1Continue sertraline 50 mg at night  2.Continue Xanax 0.5 mg twice a day as needed for anxiety 3. Next appointment: 10/1 at 9 AM

## 2019-03-03 ENCOUNTER — Other Ambulatory Visit (HOSPITAL_COMMUNITY): Payer: Self-pay | Admitting: Oncology

## 2019-03-03 DIAGNOSIS — R911 Solitary pulmonary nodule: Secondary | ICD-10-CM

## 2019-03-09 ENCOUNTER — Telehealth (HOSPITAL_COMMUNITY): Payer: Self-pay | Admitting: *Deleted

## 2019-03-09 NOTE — Telephone Encounter (Signed)
Is this about xanax? Please contact the pharmacy to allow sooner refill (order is in the system).

## 2019-03-09 NOTE — Telephone Encounter (Signed)
PATIENT REQUESTING EARLY REFILL DAUGHTER COMING HOME FROM [place] [employer] & PLANS ON TAKING HER MOM A VACATION. SHE HAS 13 PILLS LEFT. SHE WILL  BE OUT OF TOWN BEFORE EARLIEST REFILL DATE OF 8/9

## 2019-03-09 NOTE — Telephone Encounter (Signed)
Per Provider:  Contacted the pharmacy to allow sooner refill on Xanax (order is in the system).

## 2019-03-10 ENCOUNTER — Ambulatory Visit (HOSPITAL_COMMUNITY)
Admission: RE | Admit: 2019-03-10 | Discharge: 2019-03-10 | Disposition: A | Discharge status: Home / Self Care | Payer: Medicaid Other | Source: Ambulatory Visit | Attending: Oncology | Admitting: Oncology

## 2019-03-10 ENCOUNTER — Other Ambulatory Visit: Payer: Self-pay

## 2019-03-10 DIAGNOSIS — R911 Solitary pulmonary nodule: Secondary | ICD-10-CM

## 2019-03-10 LAB — GLUCOSE, CAPILLARY: Glucose-Capillary: 99 mg/dL (ref 70–99)

## 2019-03-10 MED ORDER — FLUDEOXYGLUCOSE F - 18 (FDG) INJECTION
4.9900 | Freq: Once | INTRAVENOUS | Status: AC | PRN
  Administered 2019-03-10: 13:00:00 4.99 via INTRAVENOUS

## 2019-05-02 ENCOUNTER — Other Ambulatory Visit (HOSPITAL_COMMUNITY): Payer: Self-pay | Admitting: Psychiatry
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
## 2019-05-12 ENCOUNTER — Other Ambulatory Visit (HOSPITAL_COMMUNITY): Payer: Self-pay | Admitting: Psychiatry

## 2019-05-12 ENCOUNTER — Telehealth (HOSPITAL_COMMUNITY): Payer: Self-pay | Admitting: *Deleted

## 2019-05-12 ENCOUNTER — Encounter (HOSPITAL_COMMUNITY): Payer: Self-pay | Admitting: Psychiatry

## 2019-05-12 ENCOUNTER — Other Ambulatory Visit: Payer: Self-pay

## 2019-05-12 ENCOUNTER — Ambulatory Visit (HOSPITAL_COMMUNITY): Payer: Medicaid Other | Admitting: Psychiatry

## 2019-05-12 ENCOUNTER — Ambulatory Visit (INDEPENDENT_AMBULATORY_CARE_PROVIDER_SITE_OTHER): Payer: Medicaid Other | Admitting: Psychiatry

## 2019-05-12 DIAGNOSIS — F331 Major depressive disorder, recurrent, moderate: Secondary | ICD-10-CM

## 2019-05-12 MED ORDER — SERTRALINE HCL 100 MG PO TABS: 100.0000 mg | ORAL_TABLET | Freq: Every day | ORAL | 0 refills | Status: DC

## 2019-05-12 MED ORDER — ALPRAZOLAM 0.5 MG PO TABS: 0.5000 mg | ORAL_TABLET | Freq: Two times a day (BID) | ORAL | 1 refills | Status: DC | PRN

## 2019-05-12 NOTE — Telephone Encounter (Signed)
PATIENT CALLED TO INFORM THAT HER RX STOP DELIVERING MED'S @ 4 PM.  AND HER APPT WITH YOU ISN'T UNTIL 4 SO SHE WOULD BE WITHOUT MEDICATION UNTIL TOMMORROW. ASKED IF YOU COULD PLEASE  REFILL ZOLOFT & Duanne Moron

## 2019-05-12 NOTE — Telephone Encounter (Signed)
According to the database, she received Xanax from other provider/other pharmacy on 04/26/2019 for six days. She did receive sertraline on 7/10 for 90 days. She should have both medication at least for a few days from today. Will not plan to send refill until the evaluation today.

## 2019-05-12 NOTE — Patient Instructions (Signed)
1Increase sertraline 100 mg at night  2.Continue Xanax 0.5 mg twice a day as needed for anxiety 3. Next appointment: 11/12 at 2 PM
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
## 2019-06-23 ENCOUNTER — Ambulatory Visit (INDEPENDENT_AMBULATORY_CARE_PROVIDER_SITE_OTHER): Payer: Medicare Other | Admitting: Psychiatry

## 2019-06-23 ENCOUNTER — Encounter (HOSPITAL_COMMUNITY): Payer: Self-pay | Admitting: Psychiatry

## 2019-06-23 ENCOUNTER — Other Ambulatory Visit: Payer: Self-pay

## 2019-06-23 DIAGNOSIS — F330 Major depressive disorder, recurrent, mild: Secondary | ICD-10-CM | POA: Diagnosis not present

## 2019-06-23 MED ORDER — ALPRAZOLAM 0.5 MG PO TABS: 0.5000 mg | ORAL_TABLET | Freq: Two times a day (BID) | ORAL | 2 refills | Status: DC | PRN

## 2019-06-23 MED ORDER — SERTRALINE HCL 100 MG PO TABS: 100.0000 mg | ORAL_TABLET | Freq: Every day | ORAL | 1 refills | Status: DC

## 2019-06-23 NOTE — Patient Instructions (Signed)
1Continue sertraline100 mg at night 2.Continue Xanax 0.5 mg twice a day as needed for anxiety 3. Next appointment:in 3 months

## 2019-09-13 ENCOUNTER — Other Ambulatory Visit: Payer: Self-pay

## 2019-09-13 ENCOUNTER — Encounter: Payer: Self-pay | Admitting: Psychiatry

## 2019-09-13 ENCOUNTER — Ambulatory Visit (INDEPENDENT_AMBULATORY_CARE_PROVIDER_SITE_OTHER): Payer: Medicare Other | Admitting: Psychiatry

## 2019-09-13 DIAGNOSIS — F330 Major depressive disorder, recurrent, mild: Secondary | ICD-10-CM

## 2019-09-13 DIAGNOSIS — F4310 Post-traumatic stress disorder, unspecified: Secondary | ICD-10-CM | POA: Diagnosis not present

## 2019-09-13 MED ORDER — ALPRAZOLAM 0.5 MG PO TABS: 0.5000 mg | ORAL_TABLET | Freq: Two times a day (BID) | ORAL | 1 refills | Status: DC | PRN

## 2019-09-13 MED ORDER — SERTRALINE HCL 100 MG PO TABS: ORAL_TABLET | ORAL | 0 refills | Status: DC
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
## 2019-11-01 ENCOUNTER — Other Ambulatory Visit: Payer: Self-pay

## 2019-11-01 ENCOUNTER — Ambulatory Visit (INDEPENDENT_AMBULATORY_CARE_PROVIDER_SITE_OTHER): Payer: Medicare Other | Admitting: Psychiatry

## 2019-11-01 ENCOUNTER — Encounter (HOSPITAL_COMMUNITY): Payer: Self-pay | Admitting: Psychiatry

## 2019-11-01 DIAGNOSIS — F330 Major depressive disorder, recurrent, mild: Secondary | ICD-10-CM

## 2019-11-01 MED ORDER — ALPRAZOLAM 0.5 MG PO TABS: 0.5000 mg | ORAL_TABLET | Freq: Two times a day (BID) | ORAL | 0 refills | Status: DC | PRN

## 2019-11-01 MED ORDER — BUPROPION HCL ER (XL) 150 MG PO TB24: 150.0000 mg | ORAL_TABLET | Freq: Every day | ORAL | 1 refills | Status: DC

## 2019-11-01 NOTE — Patient Instructions (Signed)
1Continuesertraline150 mg at night 2. Start bupropion 150 mg daily  3. Continue Xanax 0.5 mg twice a day as needed for anxiety  4. Next appointment:5/18 at 10:50

## 2019-12-16 ENCOUNTER — Telehealth (HOSPITAL_COMMUNITY): Payer: Self-pay

## 2019-12-16 NOTE — Telephone Encounter (Signed)
Medication management - Telephone call from patient to verify she is to be taking Bupropion 150 mg daily with her Sertraline.  Pt was questioning if this was the right medication as she read it was for smoking cessation. Patient reported no problems with the medication and informed pt a form this use to be Zyban but that she was on the Bupropion for depression.  Patient agreed that is why she is taking it as she has started her third round of treatment for cancer.  Patient to call back if any problems with the medication and will keep appointment set with Dr. Modesta Messing on 12/27/19,
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
## 2019-12-27 ENCOUNTER — Other Ambulatory Visit: Payer: Self-pay

## 2019-12-27 ENCOUNTER — Encounter (HOSPITAL_COMMUNITY): Payer: Self-pay | Admitting: Psychiatry

## 2019-12-27 ENCOUNTER — Telehealth (INDEPENDENT_AMBULATORY_CARE_PROVIDER_SITE_OTHER): Payer: Medicare Other | Admitting: Psychiatry

## 2019-12-27 DIAGNOSIS — F4310 Post-traumatic stress disorder, unspecified: Secondary | ICD-10-CM | POA: Diagnosis not present

## 2019-12-27 DIAGNOSIS — F331 Major depressive disorder, recurrent, moderate: Secondary | ICD-10-CM

## 2019-12-27 MED ORDER — ALPRAZOLAM 0.5 MG PO TABS: 0.5000 mg | ORAL_TABLET | Freq: Two times a day (BID) | ORAL | 1 refills | Status: DC | PRN

## 2019-12-27 MED ORDER — SERTRALINE HCL 100 MG PO TABS: ORAL_TABLET | ORAL | 0 refills | Status: DC

## 2019-12-27 MED ORDER — TRAZODONE HCL 50 MG PO TABS: ORAL_TABLET | ORAL | 1 refills | Status: DC

## 2019-12-27 MED ORDER — BUPROPION HCL ER (XL) 300 MG PO TB24: 300.0000 mg | ORAL_TABLET | Freq: Every day | ORAL | 0 refills | Status: DC

## 2019-12-27 NOTE — Patient Instructions (Signed)
1Continuesertraline150 mg at night 2. Increase bupropion 300 mg daily  3. Start Trazodone 25-50 mg at night as needed for anxiety 3. Continue Xanax 0.5 mg twice a day as needed for anxiety  4. Next appointment:7/1 at 9:10

## 2020-01-06 ENCOUNTER — Other Ambulatory Visit (HOSPITAL_COMMUNITY): Payer: Self-pay | Admitting: Psychiatry
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
## 2020-02-09 ENCOUNTER — Encounter (HOSPITAL_COMMUNITY): Payer: Self-pay | Admitting: Psychiatry

## 2020-02-09 ENCOUNTER — Other Ambulatory Visit: Payer: Self-pay

## 2020-02-09 ENCOUNTER — Telehealth (INDEPENDENT_AMBULATORY_CARE_PROVIDER_SITE_OTHER): Payer: Medicare Other | Admitting: Psychiatry

## 2020-02-09 DIAGNOSIS — F4310 Post-traumatic stress disorder, unspecified: Secondary | ICD-10-CM

## 2020-02-09 DIAGNOSIS — F330 Major depressive disorder, recurrent, mild: Secondary | ICD-10-CM | POA: Diagnosis not present

## 2020-02-09 MED ORDER — BUPROPION HCL ER (XL) 300 MG PO TB24: 300.0000 mg | ORAL_TABLET | Freq: Every day | ORAL | 0 refills | Status: DC

## 2020-02-09 MED ORDER — TRAZODONE HCL 50 MG PO TABS: ORAL_TABLET | ORAL | 0 refills | Status: DC

## 2020-02-09 MED ORDER — SERTRALINE HCL 100 MG PO TABS: ORAL_TABLET | ORAL | 0 refills | Status: DC

## 2020-02-09 MED ORDER — ALPRAZOLAM 0.5 MG PO TABS: 0.5000 mg | ORAL_TABLET | Freq: Two times a day (BID) | ORAL | 1 refills | Status: DC | PRN

## 2020-02-09 NOTE — Patient Instructions (Signed)
1Continuesertraline150 mg at night 2. Continue bupropion 300 mg daily  3. Continue Trazodone 25-50 mg at night as needed for insomnia 3.Continue Xanax 0.5 mg twice a day as needed for anxiety  4. Next appointment:9/9 at 9:10

## 2020-02-20 ENCOUNTER — Telehealth: Payer: Self-pay | Admitting: Adult Health

## 2020-02-20 NOTE — Telephone Encounter (Signed)
Called and LMOM that we received her name as referral for second COVID19 vaccine.  Asked that she call us back to discuss further.  Wilber Bihari, NP

## 2020-03-02 ENCOUNTER — Telehealth: Payer: Self-pay | Admitting: Physician Assistant

## 2020-03-02 NOTE — Telephone Encounter (Signed)
I connected by phone with Carmela Hurt and/or patient's caregiver on 03/02/2020 at 2:38 PM to discuss the potential vaccination through our Homebound vaccination initiative.   Prevaccination Checklist for COVID-19 Vaccines  1.  Are you feeling sick today? no  2.  Have you ever received a dose of a COVID-19 vaccine?  yes      If yes, which one? Pfizer  On 01/14/20  3.  Have you ever had an allergic reaction: (This would include a severe reaction [ e.g., anaphylaxis] that required treatment with epinephrine or EpiPen or that caused you to go to the hospital.  It would also include an allergic reaction that occurred within 4 hours that caused hives, swelling, or respiratory distress, including wheezing.) A.  A previous dose of COVID-19 vaccine. no  B.  A vaccine or injectable therapy that contains multiple components, one of which is a COVID-19 vaccine component, but it is not known which component elicited the immediate reaction. no  C.  Are you allergic to polyethylene glycol? no  D. Are you allergic to Polysorbate, which is found in some vaccines, film coated tablets and intravenous steroids?  no   4.  Have you ever had an allergic reaction to another vaccine (other than COVID-19 vaccine) or an injectable medication? (This would include a severe reaction [ e.g., anaphylaxis] that required treatment with epinephrine or EpiPen or that caused you to go to the hospital.  It would also include an allergic reaction that occurred within 4 hours that caused hives, swelling, or respiratory distress, including wheezing.)  no   5.  Have you ever had a severe allergic reaction (e.g., anaphylaxis) to something other than a component of the COVID-19 vaccine, or any vaccine or injectable medication?  This would include food, pet, venom, environmental, or oral medication allergies.  no   6.  Have you received any vaccine in the last 14 days? no   7.  Have you ever had a positive test for COVID-19 or has a  doctor ever told you that you had COVID-19?  no   8.  Have you received passive antibody therapy (monoclonal antibodies or convalescent serum) as a treatment for COVID-19? no   9.  Do you have a weakened immune system caused by something such as HIV infection or cancer or do you take immunosuppressive drugs or therapies?  yes, she has active cancer being treated with chemo  10.  Do you have a bleeding disorder or are you taking a blood thinner? no   11.  Are you pregnant or breast-feeding? no   12.  Do you have dermal fillers? no   __________________   This patient is a 54 y.o. female that meets the FDA criteria to receive homebound vaccination. Patient or parent/caregiver understands they have the option to accept or refuse homebound vaccination.  Patient passed the pre-screening checklist and would like to proceed with homebound vaccination.  Based on questionnaire above, I recommend the patient be observed for 30 minutes.  There are an estimated  other household members/caregivers who are also interested in receiving the vaccine.    I will send the patient's information to our scheduling team who will reach out to schedule the patient and potential caregiver/family members for homebound vaccination. Of note, she has been cleared by her oncologist for the second shot. She will check with her MD about when would be the ideal time to receive vaccination in the context of her ongoing chemo treatments (q 14 days)  Angelena Form 03/02/2020 2:38 PM

## 2020-03-13 ENCOUNTER — Ambulatory Visit: Payer: Self-pay

## 2020-03-14 ENCOUNTER — Ambulatory Visit: Payer: Medicaid Other | Attending: Critical Care Medicine

## 2020-03-14 DIAGNOSIS — Z23 Encounter for immunization: Secondary | ICD-10-CM

## 2020-03-14 NOTE — Progress Notes (Signed)
   Covid-19 Vaccination Clinic  Name:  Jamie Salinas    MRN: [id number] DOB: Jan 13, 1966  03/14/2020  [name] was observed post Covid-19 immunization for 15 minutes without incident. She was provided with Vaccine Information Sheet and instruction to access the V-Safe system.   [name] was instructed to call 911 with any severe reactions post vaccine: Marland Kitchen Difficulty breathing  . Swelling of face and throat  . A fast heartbeat  . A bad rash all over body  . Dizziness and weakness   Immunizations Administered    Name Date Dose VIS Date Route   Pfizer COVID-19 Vaccine 03/14/2020 10:00 AM 0.3 mL 10/05/2018 Intramuscular   Manufacturer: [employer]   Lot: [id number]   [place]: [id number]
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
## 2020-04-19 ENCOUNTER — Telehealth (HOSPITAL_COMMUNITY): Payer: Medicare Other | Admitting: Psychiatry

## 2020-04-19 ENCOUNTER — Telehealth (HOSPITAL_COMMUNITY): Payer: Self-pay | Admitting: Psychiatry

## 2020-04-19 ENCOUNTER — Other Ambulatory Visit: Payer: Self-pay

## 2020-04-19 NOTE — Telephone Encounter (Signed)
Called the patient twice for appointment scheduled today. The patient did not answer the phone. No option to leave a voice message.

## 2020-04-26 ENCOUNTER — Telehealth (HOSPITAL_COMMUNITY): Payer: Self-pay | Admitting: Psychiatry

## 2020-04-26 DIAGNOSIS — F330 Major depressive disorder, recurrent, mild: Secondary | ICD-10-CM

## 2020-04-26 MED ORDER — ALPRAZOLAM 0.5 MG PO TABS: 0.5000 mg | ORAL_TABLET | Freq: Two times a day (BID) | ORAL | 0 refills | Status: DC | PRN

## 2020-04-26 NOTE — Telephone Encounter (Signed)
I have utilized the [place] Controlled Substances Reporting System (PMP AWARxE) to confirm adherence regarding the patient's medication. My review reveals appropriate prescription fills.  
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
## 2020-05-17 ENCOUNTER — Other Ambulatory Visit: Payer: Self-pay

## 2020-05-17 ENCOUNTER — Telehealth (HOSPITAL_COMMUNITY): Payer: Self-pay | Admitting: Psychiatry

## 2020-05-17 ENCOUNTER — Telehealth (INDEPENDENT_AMBULATORY_CARE_PROVIDER_SITE_OTHER): Payer: Medicare Other | Admitting: Psychiatry

## 2020-05-17 ENCOUNTER — Encounter (HOSPITAL_COMMUNITY): Payer: Self-pay | Admitting: Psychiatry

## 2020-05-17 DIAGNOSIS — F330 Major depressive disorder, recurrent, mild: Secondary | ICD-10-CM

## 2020-05-17 DIAGNOSIS — F4310 Post-traumatic stress disorder, unspecified: Secondary | ICD-10-CM

## 2020-05-17 MED ORDER — TRAZODONE HCL 50 MG PO TABS: ORAL_TABLET | ORAL | 0 refills | Status: DC

## 2020-05-17 MED ORDER — ALPRAZOLAM 0.5 MG PO TABS: 0.5000 mg | ORAL_TABLET | Freq: Two times a day (BID) | ORAL | 2 refills | Status: DC | PRN

## 2020-05-17 MED ORDER — BUPROPION HCL ER (XL) 300 MG PO TB24: 300.0000 mg | ORAL_TABLET | Freq: Every day | ORAL | 0 refills | Status: DC

## 2020-05-17 MED ORDER — SERTRALINE HCL 100 MG PO TABS: ORAL_TABLET | ORAL | 0 refills | Status: DC

## 2020-05-17 NOTE — Telephone Encounter (Signed)
Called the patient twice for appointment scheduled today. The patient did not answer the phone. No option to leave a voice message.
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
## 2020-08-23 ENCOUNTER — Encounter: Payer: Self-pay | Admitting: Psychiatry

## 2020-08-23 ENCOUNTER — Other Ambulatory Visit: Payer: Self-pay

## 2020-08-23 ENCOUNTER — Telehealth (INDEPENDENT_AMBULATORY_CARE_PROVIDER_SITE_OTHER): Payer: Medicare Other | Admitting: Psychiatry

## 2020-08-23 DIAGNOSIS — F4310 Post-traumatic stress disorder, unspecified: Secondary | ICD-10-CM

## 2020-08-23 DIAGNOSIS — F330 Major depressive disorder, recurrent, mild: Secondary | ICD-10-CM | POA: Diagnosis not present

## 2020-08-23 MED ORDER — BUPROPION HCL ER (XL) 300 MG PO TB24: 300.0000 mg | ORAL_TABLET | Freq: Every day | ORAL | 1 refills | Status: DC

## 2020-08-23 MED ORDER — TRAZODONE HCL 50 MG PO TABS: ORAL_TABLET | ORAL | 1 refills | Status: DC

## 2020-08-23 MED ORDER — SERTRALINE HCL 100 MG PO TABS: ORAL_TABLET | ORAL | 1 refills | Status: DC

## 2020-08-23 MED ORDER — ALPRAZOLAM 0.5 MG PO TABS: 0.5000 mg | ORAL_TABLET | Freq: Two times a day (BID) | ORAL | 2 refills | Status: DC | PRN
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
## 2020-11-22 ENCOUNTER — Other Ambulatory Visit: Payer: Self-pay

## 2020-11-22 ENCOUNTER — Encounter: Payer: Self-pay | Admitting: Psychiatry

## 2020-11-22 ENCOUNTER — Telehealth (INDEPENDENT_AMBULATORY_CARE_PROVIDER_SITE_OTHER): Payer: Medicare Other | Admitting: Psychiatry

## 2020-11-22 DIAGNOSIS — F330 Major depressive disorder, recurrent, mild: Secondary | ICD-10-CM | POA: Diagnosis not present

## 2020-11-22 DIAGNOSIS — F4310 Post-traumatic stress disorder, unspecified: Secondary | ICD-10-CM | POA: Diagnosis not present

## 2020-11-22 MED ORDER — ALPRAZOLAM 0.5 MG PO TABS: 0.5000 mg | ORAL_TABLET | Freq: Two times a day (BID) | ORAL | 2 refills | Status: DC | PRN

## 2020-11-22 NOTE — Patient Instructions (Addendum)
1Continuesertraline150 mg at night 2.Continuebupropion 300 mg daily 3.ContinueTrazodone 25-50 mg at night(and 25 mg during the day around chemotherapy)as needed for insomnia-  4.Continue Xanax 0.5 mg twice a day as needed for anxiety  5.Next appointment:7/11 at 9 AM   The next visit will be in person visit. Please arrive 15 mins before the scheduled time.   [facility]  Address: [address], [place], [place] [address]

## 2021-01-15 NOTE — Progress Notes (Signed)
I connected by phone with Jamie Salinas and/or patient's caregiver on 01/15/2021 at 10:56 AM to discuss the potential vaccination through our Homebound vaccination initiative.   Prevaccination Checklist for COVID-19 Vaccines  1.  Are you feeling sick today? no   2.  Have you ever received a dose of a COVID-19 vaccine?  yes      If yes, which one? Pfizer   How many dose of Covid-19 vaccine have your received and dates ? ___see immunizations________   Check all that apply: I live in a long-term care setting. no  I have been diagnosed with a medical condition(s). Please list: ________see chart_______________ (pertinent to homebound status)  I am a first responder. no  I work in a long-term care facility, correctional facility, hospital, restaurant, retail setting, school, or other setting with high exposure to the public. no  4. Do you have a health condition or are you undergoing treatment that makes you moderately or severely immunocompromised? (This would include treatment for cancer or HIV, receipt of organ transplant, immunosuppressive therapy or high-dose corticosteroids, CAR-T-cell therapy, hematopoietic cell transplant [HCT], DiGeorge syndrome or Wiskott-Aldrich syndrome)  yes  5. Have you received hematopoietic cell transplant (HCT) or CAR-T-cell therapies since receiving COVID-19 vaccine? no  6.  Have you ever had an allergic reaction: (This would include a severe reaction [ e.g., anaphylaxis] that required treatment with epinephrine or EpiPen or that caused you to go to the hospital.  It would also include an allergic reaction that occurred within 4 hours that caused hives, swelling, or respiratory distress, including wheezing.) A.  A previous dose of COVID-19 vaccine. yes  B.  A vaccine or injectable therapy that contains multiple components, one of which is a COVID-19 vaccine component, but it is not known which component elicited the immediate reaction. no  C.  Are you allergic to  polyethylene glycol? no  D. Are you allergic to Polysorbate, which is found in some vaccines, film coated tablets and intravenous steroids?  no   7.  Have you ever had an allergic reaction to another vaccine (other than COVID-19 vaccine) or an injectable medication? (This would include a severe reaction [ e.g., anaphylaxis] that required treatment with epinephrine or EpiPen or that caused you to go to the hospital.  It would also include an allergic reaction that occurred within 4 hours that caused hives, swelling, or respiratory distress, including wheezing.)  no   8.  Have you ever had a severe allergic reaction (e.g., anaphylaxis) to something other than a component of the COVID-19 vaccine, or any vaccine or injectable medication?  This would include food, pet, venom, environmental, or oral medication allergies.  no   Check all that apply to you:  Am a female between ages 55 and 64 years old  no  Women 98 through 57 years of age can receive any FDA-authorized or -approved COVID-19 vaccine. However, they should be informed of the rare but increased risk of thrombosis with thrombocytopenia syndrome (TTS) after receipt of the [employer] Vaccine and the availability of other FDA-authorized and -approved COVID-19 vaccines. People who had TTS after a first dose of Janssen vaccine should not receive a subsequent dose of Janssen product    Am a female between ages 75 and 47 years old  no Males 5 through 57 years of age may receive the correct formulation of Pfizer-BioNTech COVID-19 vaccine. Males 18 and older can receive any FDA-authorized or -approved vaccine. However, people receiving an mRNA COVID-19 vaccine, especially  males 66 through 57 years of age and their parents/legal representative (when relevant), should be informed of the risk of developing myocarditis (an inflammation of the heart muscle) or pericarditis (inflammation of the lining around the heart) after receipt of an mRNA vaccine. The  risk of developing either myocarditis or pericarditis after vaccination is low, and lower than the risk of myocarditis associated with SARS-CoV-2 infection in adolescents and adults. Vaccine recipients should be counseled about the need to seek care if symptoms of myocarditis or pericarditis develop after vaccination     Have a history of myocarditis or pericarditis  no Myocarditis or pericarditis after receipt of the first dose of an mRNA COVID-19 vaccine series but before administration of the second dose  Experts advise that people who develop myocarditis or pericarditis after a dose of an mRNA COVID-19 vaccine not receive a subsequent dose of any COVID-19 vaccine, until additional safety data are available.  Administration of a subsequent dose of COVID-19 vaccine before safety data are available can be considered in certain circumstances after the episode of myocarditis or pericarditis has completely resolved. Until additional data are available, some experts recommend a Alphonsa Overall COVID-19 vaccine be considered instead of an mRNA COVID-19 vaccine. Decisions about proceeding with a subsequent dose should include a conversation between the patient, their parent/legal representative (when relevant), and their clinical team, which may include a cardiologist.    Have been treated with monoclonal antibodies or convalescent serum to prevent or treat COVID-19  no Vaccination should be offered to people regardless of history of prior symptomatic or asymptomatic SARS-CoV-2 infection. There is no recommended minimal interval between infection and vaccination.  However, vaccination should be deferred if a patient received monoclonal antibodies or convalescent serum as treatment for COVID-19 or for post-exposure prophylaxis. This is a precautionary measure until additional information becomes available, to avoid interference of the antibody treatment with vaccine-induced immune responses.  Defer COVID-19 vaccination  for 30 days when a passive antibody product was used for post-exposure prophylaxis.  Defer COVID-19 vaccination for 90 days when a passive antibody product was used to treat COVID-19.     Diagnosed with Multisystem Inflammatory Syndrome (MIS-C or MIS-A) after a COVID-19 infection  no It is unknown if people with a history of MIS-C or MIS-A are at risk for a dysregulated immune response to COVID-19 vaccination.  People with a history of MIS-C or MIS-A may choose to be vaccinated. Considerations for vaccination may include:   Clinical recovery from MIS-C or MIS-A, including return to normal cardiac function   Personal risk of severe acute COVID-19 (e.g., age, underlying conditions)   High or substantial community transmission of SARS-CoV-2 and personal increased risk of reinfection.   Timing of any immunomodulatory therapies (general best practice guidelines for immunization can be consulted for more information [email or web address])   It has been 90 days or more since their diagnosis of MIS-C   Onset of MIS-C occurred before any COVID-19 vaccination   A conversation between the patient, their guardian(s), and their clinical team or a specialist may assist with COVID-19 vaccination decisions. Healthcare providers and health departments may also request a consultation from the [place] at [email or web address].     Have a bleeding disorder  no Take a blood thinner  no As with all vaccines, any COVID-19 vaccine product may be given to these patients, if a physician familiar with the patient's bleeding risk determines that the vaccine can be administered intramuscularly with reasonable safety.  ACIP  recommends the following technique for intramuscular vaccination in patients with bleeding disorders or taking blood thinners: a fine-gauge needle (23-gauge or smaller  caliber) should be used for the vaccination, followed by firm pressure on the site, without rubbing, for at least 2 minutes.  People who regularly take aspirin or anticoagulants as part of their routine medications do not need to stop these medications prior to receipt of any COVID-19 vaccine.    Have a history of heparin-induced thrombocytopenia (HIT)  no Although the etiology of TTS associated with the Alphonsa Overall COVID-19 vaccine is unclear, it appears to be similar to another rare immune-mediated syndrome, heparin-induced thrombocytopenia (HIT). People with a history of an episode of an immune-mediated syndrome characterized by thrombosis and thrombocytopenia, such as HIT, should be offered a currently FDA-approved or FDA-authorized mRNA COVID-19 vaccine if it has been ?90 days since their TTS resolved. After 90 days, patients may be vaccinated with any currently FDA-approved or FDA-authorized COVID-19 vaccine, including Janssen COVID-19 Vaccine. However, people who developed TTS after their initial Alphonsa Overall vaccine should not receive a Janssen booster dose.  Experts believe the following factors do not make people more susceptible to TTS after receipt of the [employer]. People with these conditions can be vaccinated with any FDA-authorized or - approved COVID-19 vaccine, including the [employer] COVID-19 Vaccine:   A prior history of venous thromboembolism   Risk factors for venous thromboembolism (e.g., inherited or acquired thrombophilia including Factor V Leiden; prothrombin gene 20210A mutation; antiphospholipid syndrome; protein C, protein S or antithrombin deficiency   A prior history of other types of thromboses not associated with thrombocytopenia   Pregnancy, post-partum status, or receipt of hormonal contraceptives (e.g., combined oral contraceptives, patch, ring)   Additional recipient education materials can be found at [email or web address]  vaccines/safety/JJUpdate.html.    Am currently pregnant or breastfeeding  no Vaccination is recommended for all people aged 52 years and older, including people that are:   Pregnant   Breastfeeding   Trying to get pregnant now or who might become pregnant in the future   Pregnant, breastfeeding, and post-partum people 22 through 57 years of age should be aware of the rare risk of TTS after receipt of the Alphonsa Overall COVID-19 Vaccine and the availability of other FDA-authorized or -approved COVID-19 vaccines (i.e., mRNA vaccines).    Have received dermal fillers  no FDA-authorized or -approved COVID-19 vaccines can be administered to people who have received injectable dermal fillers who have no contraindications for vaccination.  Infrequently, these people might experience temporary swelling at or near the site of filler injection (usually the face or lips) following administration of a dose of an mRNA COVID-19 vaccine. These people should be advised to contact their healthcare provider if swelling develops at or near the site of dermal filler following vaccination.     Have a history of Guillain-Barr Syndrome (GBS)  no People with a history of GBS can receive any FDA-authorized or -approved COVID-19 vaccine. However, given the possible association between the [employer] and an increased risk of GBS, a patient with a history of GBS and their clinical team should discuss the availability of mRNA vaccines to offer protection against COVID-19. The highest risk has been observed in men aged 15-64 years with symptoms of GBS beginning within 42 days after Alphonsa Overall COVID-19 vaccination.  People who had GBS after receiving Janssen vaccine should be made aware of the option to receive an mRNA COVID-19 vaccine booster at least 2 months (8 weeks) after the  Janssen dose. However, Alphonsa Overall vaccine may be used as a booster, particularly if GBS occurred more than 42 days after vaccination or was related  to a non-vaccine factor. Prior to booster vaccination, a conversation between the patient and their clinical team may assist with decisions about use of a COVID-19 booster dose, including the timing of administration     Postvaccination Observation Times for People without Contraindications to Covid 19 Vaccination.  30 minutes:  People with a history of: A contraindication to another type of COVID-19 vaccine product (i.e., mRNA or viral vector COVID-19 vaccines)   Immediate (within 4 hours of exposure) non-severe allergic reaction to a COVID-19 vaccine or injectable therapies   Anaphylaxis due to any cause   Immediate allergic reaction of any severity to a non-COVID-19 vaccine   15 minutes: All other people  This patient is a 55 y.o. female that meets the FDA criteria to receive homebound vaccination. Patient or parent/caregiver understands they have the option to accept or refuse homebound vaccination.  Patient passed the pre-screening checklist and would like to proceed with homebound vaccination.  Based on questionnaire above, I recommend the patient be observed for 15 minutes.  There are an estimated # 0 other household members/caregivers who are also interested in receiving the vaccine.    The patient has been confirmed homebound and eligible for homebound vaccination with the considerations outlined above. I will send the patient's information to our scheduling team who will reach out to schedule the patient and potential caregiver/family members for homebound vaccination.    Gigi Gin 01/15/2021 10:56 AM

## 2021-01-16 ENCOUNTER — Other Ambulatory Visit: Payer: Self-pay

## 2021-01-16 ENCOUNTER — Ambulatory Visit: Payer: Medicare Other | Attending: Critical Care Medicine

## 2021-01-16 DIAGNOSIS — Z23 Encounter for immunization: Secondary | ICD-10-CM

## 2021-01-16 NOTE — Progress Notes (Signed)
   Covid-19 Vaccination Clinic  Name:  Jamie Salinas    MRN: [id number] DOB: [birth year]  01/16/2021  Ms. Jamie Salinas was observed post Covid-19 immunization for 15 minutes without incident. She was provided with Vaccine Information Sheet and instruction to access the V-Safe system.   Ms. Jamie Salinas was instructed to call 911 with any severe reactions post vaccine: Marland Kitchen Difficulty breathing  . Swelling of face and throat  . A fast heartbeat  . A bad rash all over body  . Dizziness and weakness   Immunizations Administered    Name Date Dose VIS Date Route   PFIZER Comrnaty(Gray TOP) Covid-19 Vaccine 01/16/2021  5:21 PM 0.3 mL 07/19/2020 Intramuscular   Manufacturer: [employer]   Lot: [id number]   [place]: [phone]

## 2021-01-24 ENCOUNTER — Telehealth: Payer: Self-pay

## 2021-01-24 NOTE — Telephone Encounter (Signed)
received fax request for the trazodone 50mg 

## 2021-01-24 NOTE — Telephone Encounter (Signed)
Decline- ordered 90 days with one refill of medication in January; she should have enough meds until next visit.

## 2021-02-18 ENCOUNTER — Telehealth (INDEPENDENT_AMBULATORY_CARE_PROVIDER_SITE_OTHER): Payer: Medicare Other | Admitting: Psychiatry

## 2021-02-18 ENCOUNTER — Encounter: Payer: Self-pay | Admitting: Psychiatry

## 2021-02-18 ENCOUNTER — Other Ambulatory Visit: Payer: Self-pay

## 2021-02-18 DIAGNOSIS — F4310 Post-traumatic stress disorder, unspecified: Secondary | ICD-10-CM

## 2021-02-18 DIAGNOSIS — F330 Major depressive disorder, recurrent, mild: Secondary | ICD-10-CM

## 2021-02-18 DIAGNOSIS — F3341 Major depressive disorder, recurrent, in partial remission: Secondary | ICD-10-CM

## 2021-02-18 MED ORDER — BUPROPION HCL ER (XL) 300 MG PO TB24: 300.0000 mg | ORAL_TABLET | Freq: Every day | ORAL | 1 refills | Status: DC

## 2021-02-18 MED ORDER — TRAZODONE HCL 50 MG PO TABS: ORAL_TABLET | ORAL | 1 refills | Status: DC

## 2021-02-18 MED ORDER — ALPRAZOLAM 0.5 MG PO TABS: 0.5000 mg | ORAL_TABLET | Freq: Two times a day (BID) | ORAL | 2 refills | Status: DC | PRN

## 2021-02-18 MED ORDER — SERTRALINE HCL 100 MG PO TABS: ORAL_TABLET | ORAL | 1 refills | Status: DC
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
## 2021-03-26 ENCOUNTER — Other Ambulatory Visit (HOSPITAL_COMMUNITY): Payer: Self-pay | Admitting: Nurse Practitioner

## 2021-03-26 DIAGNOSIS — C3410 Malignant neoplasm of upper lobe, unspecified bronchus or lung: Secondary | ICD-10-CM

## 2021-04-04 ENCOUNTER — Encounter (HOSPITAL_COMMUNITY): Admission: RE | Admit: 2021-04-04 | Payer: Medicare Other | Source: Ambulatory Visit

## 2021-04-11 ENCOUNTER — Encounter (HOSPITAL_COMMUNITY)
Admission: RE | Admit: 2021-04-11 | Discharge: 2021-04-11 | Disposition: A | Discharge status: Home / Self Care | Payer: Medicare Other | Source: Ambulatory Visit | Attending: Nurse Practitioner | Admitting: Nurse Practitioner

## 2021-04-11 ENCOUNTER — Other Ambulatory Visit: Payer: Self-pay

## 2021-04-11 DIAGNOSIS — C3410 Malignant neoplasm of upper lobe, unspecified bronchus or lung: Secondary | ICD-10-CM | POA: Diagnosis not present

## 2021-04-11 MED ORDER — FLUDEOXYGLUCOSE F - 18 (FDG) INJECTION
5.0000 | Freq: Once | INTRAVENOUS | Status: AC | PRN
  Administered 2021-04-11: 5 via INTRAVENOUS

## 2021-04-17 ENCOUNTER — Other Ambulatory Visit (HOSPITAL_COMMUNITY): Payer: Self-pay | Admitting: Radiology

## 2021-04-17 DIAGNOSIS — C3410 Malignant neoplasm of upper lobe, unspecified bronchus or lung: Secondary | ICD-10-CM

## 2021-04-17 DIAGNOSIS — J449 Chronic obstructive pulmonary disease, unspecified: Secondary | ICD-10-CM

## 2021-05-07 ENCOUNTER — Other Ambulatory Visit (HOSPITAL_COMMUNITY): Payer: Self-pay | Admitting: Respiratory Therapy

## 2021-05-07 DIAGNOSIS — C3410 Malignant neoplasm of upper lobe, unspecified bronchus or lung: Secondary | ICD-10-CM

## 2021-05-07 DIAGNOSIS — J449 Chronic obstructive pulmonary disease, unspecified: Secondary | ICD-10-CM
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
## 2021-05-20 ENCOUNTER — Telehealth (INDEPENDENT_AMBULATORY_CARE_PROVIDER_SITE_OTHER): Payer: Medicare Other | Admitting: Psychiatry

## 2021-05-20 ENCOUNTER — Encounter: Payer: Self-pay | Admitting: Psychiatry

## 2021-05-20 ENCOUNTER — Other Ambulatory Visit: Payer: Self-pay

## 2021-05-20 DIAGNOSIS — F330 Major depressive disorder, recurrent, mild: Secondary | ICD-10-CM

## 2021-05-20 DIAGNOSIS — F4310 Post-traumatic stress disorder, unspecified: Secondary | ICD-10-CM

## 2021-05-20 MED ORDER — ALPRAZOLAM 0.5 MG PO TABS: 0.5000 mg | ORAL_TABLET | Freq: Three times a day (TID) | ORAL | 2 refills | Status: DC | PRN

## 2021-05-20 NOTE — Patient Instructions (Signed)
1  Continue sertraline 150 mg at night  2. Continue bupropion 300 mg daily  3. Continue Trazodone 25-50 mg at night (and 25 mg during the day around chemotherapy) as needed for insomnia 4. Increase Xanax 0.5 mg three times a day as needed for anxiety  5. Next appointment: 1/4 at 1:20

## 2021-07-17 ENCOUNTER — Other Ambulatory Visit: Payer: Self-pay | Admitting: Psychiatry

## 2021-07-17 DIAGNOSIS — F330 Major depressive disorder, recurrent, mild: Secondary | ICD-10-CM

## 2021-08-06 ENCOUNTER — Other Ambulatory Visit: Payer: Self-pay | Admitting: Psychiatry

## 2021-08-06 DIAGNOSIS — F330 Major depressive disorder, recurrent, mild: Secondary | ICD-10-CM

## 2021-08-11 HISTORY — PX: OTHER SURGICAL HISTORY: SHX169
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
## 2021-08-14 ENCOUNTER — Other Ambulatory Visit: Payer: Self-pay | Admitting: Psychiatry

## 2021-08-14 ENCOUNTER — Other Ambulatory Visit: Payer: Self-pay

## 2021-08-14 ENCOUNTER — Telehealth (INDEPENDENT_AMBULATORY_CARE_PROVIDER_SITE_OTHER): Payer: Medicare Other | Admitting: Psychiatry

## 2021-08-14 ENCOUNTER — Encounter: Payer: Self-pay | Admitting: Psychiatry

## 2021-08-14 DIAGNOSIS — F4310 Post-traumatic stress disorder, unspecified: Secondary | ICD-10-CM | POA: Diagnosis not present

## 2021-08-14 DIAGNOSIS — F330 Major depressive disorder, recurrent, mild: Secondary | ICD-10-CM

## 2021-08-14 MED ORDER — BUPROPION HCL ER (XL) 300 MG PO TB24: 300.0000 mg | ORAL_TABLET | Freq: Every day | ORAL | 0 refills | Status: DC

## 2021-08-14 MED ORDER — SERTRALINE HCL 100 MG PO TABS: 150.0000 mg | ORAL_TABLET | Freq: Every day | ORAL | 1 refills | Status: DC

## 2021-08-14 MED ORDER — ALPRAZOLAM 0.5 MG PO TABS: 0.5000 mg | ORAL_TABLET | Freq: Three times a day (TID) | ORAL | 2 refills | Status: DC | PRN

## 2021-08-14 NOTE — Patient Instructions (Addendum)
1  Continue sertraline 150 mg at night  2. Continue bupropion 300 mg daily  3. Continue Trazodone 25-50 mg at night  4. Continue Xanax 0.5 mg three times a day as needed for anxiety  5. Next appointment: 4/3 at 9 AM, in person  The next visit will be in person visit. Please arrive 15 mins before the scheduled time.   [facility]  Address: [address], [place], [place] [address]

## 2021-08-19 ENCOUNTER — Telehealth: Payer: Self-pay

## 2021-08-19 ENCOUNTER — Other Ambulatory Visit: Payer: Self-pay | Admitting: Psychiatry

## 2021-08-19 DIAGNOSIS — F330 Major depressive disorder, recurrent, mild: Secondary | ICD-10-CM

## 2021-08-19 MED ORDER — ALPRAZOLAM 0.5 MG PO TABS: 0.5000 mg | ORAL_TABLET | Freq: Three times a day (TID) | ORAL | 2 refills | Status: DC | PRN

## 2021-08-19 NOTE — Telephone Encounter (Signed)
left message on doctor line about rx.

## 2021-08-19 NOTE — Telephone Encounter (Signed)
Sorry, it was in error due to technical issues. Ordered correct ones for her to pick up when due. Please contact the pharmacy and cancel previous orders (45 tabs per month).

## 2021-08-19 NOTE — Telephone Encounter (Signed)
Medication mangement - Telephone call with patient, after she left another message to verify everything she needed had been resolved.  Pt. reported everything was taken care of with her prescription for Alprazolam earlier today

## 2021-08-19 NOTE — Telephone Encounter (Signed)
pt called she wants to know why you only gave her 45 pills when it supposed to be 90 pills of the xanxax

## 2021-08-19 NOTE — Telephone Encounter (Signed)
pt was called and told rx was corrected and sent in.
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
## 2021-11-11 ENCOUNTER — Telehealth (INDEPENDENT_AMBULATORY_CARE_PROVIDER_SITE_OTHER): Payer: Medicare Other | Admitting: Psychiatry

## 2021-11-11 ENCOUNTER — Encounter: Payer: Self-pay | Admitting: Psychiatry

## 2021-11-11 DIAGNOSIS — F330 Major depressive disorder, recurrent, mild: Secondary | ICD-10-CM | POA: Diagnosis not present

## 2021-11-11 DIAGNOSIS — F4310 Post-traumatic stress disorder, unspecified: Secondary | ICD-10-CM

## 2021-11-11 MED ORDER — ALPRAZOLAM 0.5 MG PO TABS: 0.5000 mg | ORAL_TABLET | Freq: Three times a day (TID) | ORAL | 2 refills | Status: DC | PRN

## 2021-11-11 MED ORDER — BUPROPION HCL ER (XL) 300 MG PO TB24: 300.0000 mg | ORAL_TABLET | Freq: Every day | ORAL | 0 refills | Status: DC

## 2021-11-11 NOTE — Patient Instructions (Signed)
1  Continue sertraline 150 mg at night  ?2. Continue bupropion 300 mg daily  ?3. Continue Trazodone 25-50 mg at night  ?4. Continue Xanax 0.5 mg three times a day as needed for anxiety  ?5. Next appointment: 6/26 at 8:20, video ?

## 2021-12-05 ENCOUNTER — Telehealth: Payer: Self-pay

## 2021-12-05 NOTE — Telephone Encounter (Signed)
Error
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
## 2022-02-03 ENCOUNTER — Encounter: Payer: Self-pay | Admitting: Psychiatry

## 2022-02-03 ENCOUNTER — Telehealth (INDEPENDENT_AMBULATORY_CARE_PROVIDER_SITE_OTHER): Payer: Medicare Other | Admitting: Psychiatry

## 2022-02-03 DIAGNOSIS — F330 Major depressive disorder, recurrent, mild: Secondary | ICD-10-CM

## 2022-02-03 DIAGNOSIS — G4700 Insomnia, unspecified: Secondary | ICD-10-CM | POA: Diagnosis not present

## 2022-02-03 DIAGNOSIS — F4310 Post-traumatic stress disorder, unspecified: Secondary | ICD-10-CM

## 2022-02-03 MED ORDER — ALPRAZOLAM 0.5 MG PO TABS: 0.5000 mg | ORAL_TABLET | Freq: Three times a day (TID) | ORAL | 0 refills | Status: DC | PRN
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
## 2022-03-21 ENCOUNTER — Encounter: Payer: Self-pay | Admitting: Psychiatry

## 2022-03-21 ENCOUNTER — Telehealth (INDEPENDENT_AMBULATORY_CARE_PROVIDER_SITE_OTHER): Payer: Medicare Other | Admitting: Psychiatry

## 2022-03-21 DIAGNOSIS — F3341 Major depressive disorder, recurrent, in partial remission: Secondary | ICD-10-CM

## 2022-03-21 DIAGNOSIS — G4700 Insomnia, unspecified: Secondary | ICD-10-CM | POA: Diagnosis not present

## 2022-03-21 DIAGNOSIS — F4310 Post-traumatic stress disorder, unspecified: Secondary | ICD-10-CM

## 2022-03-21 MED ORDER — SERTRALINE HCL 100 MG PO TABS: 150.0000 mg | ORAL_TABLET | Freq: Every day | ORAL | 1 refills | Status: DC

## 2022-03-21 MED ORDER — ALPRAZOLAM 0.5 MG PO TABS: 0.5000 mg | ORAL_TABLET | Freq: Three times a day (TID) | ORAL | 1 refills | Status: DC | PRN

## 2022-03-21 MED ORDER — BUPROPION HCL ER (XL) 300 MG PO TB24: 300.0000 mg | ORAL_TABLET | Freq: Every day | ORAL | 0 refills | Status: DC

## 2022-03-21 NOTE — Patient Instructions (Signed)
1  Continue sertraline 150 mg at night  2. Continue bupropion 300 mg daily  3. Continue Trazodone 25-50 mg at night (and 25 mg during the day around chemotherapy) as needed for insomnia 4. Continue Xanax 0.5 mg three times a day as needed for anxiety  5. Next appointment: 10/9 at 9:30

## 2022-03-27 ENCOUNTER — Telehealth: Payer: Self-pay

## 2022-03-27 ENCOUNTER — Other Ambulatory Visit: Payer: Self-pay | Admitting: Psychiatry

## 2022-03-27 MED ORDER — TRAZODONE HCL 50 MG PO TABS
ORAL_TABLET | ORAL | 1 refills | Status: DC
Start: 2022-03-27 — End: 2022-09-25

## 2022-03-27 NOTE — Telephone Encounter (Signed)
Which medication is she referring to, and how long is the trip? There should be all of her medication, including xanax she can pick up at the pharmacy.

## 2022-03-27 NOTE — Telephone Encounter (Signed)
Ordered Trazodone. She should have xanax order in the pharmacy.

## 2022-03-27 NOTE — Telephone Encounter (Signed)
pt called states that her daughter and her are going away for her prewedding party and she needs to pick up medications today so that she will have with her when they leave tomorrow.
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
## 2022-05-19 ENCOUNTER — Encounter: Payer: Self-pay | Admitting: Psychiatry

## 2022-05-19 ENCOUNTER — Telehealth (INDEPENDENT_AMBULATORY_CARE_PROVIDER_SITE_OTHER): Payer: Medicare Other | Admitting: Psychiatry

## 2022-05-19 DIAGNOSIS — F3341 Major depressive disorder, recurrent, in partial remission: Secondary | ICD-10-CM | POA: Diagnosis not present

## 2022-05-19 DIAGNOSIS — F4310 Post-traumatic stress disorder, unspecified: Secondary | ICD-10-CM | POA: Diagnosis not present

## 2022-05-19 DIAGNOSIS — G4700 Insomnia, unspecified: Secondary | ICD-10-CM | POA: Diagnosis not present

## 2022-05-19 MED ORDER — BUPROPION HCL ER (XL) 300 MG PO TB24: 300.0000 mg | ORAL_TABLET | Freq: Every day | ORAL | 1 refills | Status: DC

## 2022-05-19 MED ORDER — ALPRAZOLAM 0.5 MG PO TABS: 0.5000 mg | ORAL_TABLET | Freq: Two times a day (BID) | ORAL | 1 refills | Status: DC | PRN

## 2022-05-19 NOTE — Addendum Note (Signed)
Addended by: Norman Clay on: 05/19/2022 04:19 PM   Modules accepted: Orders

## 2022-05-19 NOTE — Patient Instructions (Signed)
1  Continue sertraline 150 mg at night  2. Continue bupropion 300 mg daily  3. Continue Trazodone 25-50 mg at night  4. Decrease Xanax 0.5 mg twice a day as needed for anxiety  5. Next appointment: 12/4 at 11 AM

## 2022-05-20 ENCOUNTER — Other Ambulatory Visit: Payer: Self-pay | Admitting: Psychiatry

## 2022-05-20 DIAGNOSIS — F3341 Major depressive disorder, recurrent, in partial remission: Secondary | ICD-10-CM

## 2022-05-20 MED ORDER — ALPRAZOLAM 0.5 MG PO TABS: 0.5000 mg | ORAL_TABLET | Freq: Two times a day (BID) | ORAL | 1 refills | Status: DC | PRN
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
## 2022-07-14 ENCOUNTER — Telehealth (INDEPENDENT_AMBULATORY_CARE_PROVIDER_SITE_OTHER): Payer: Medicare Other | Admitting: Psychiatry

## 2022-07-14 ENCOUNTER — Encounter: Payer: Self-pay | Admitting: Psychiatry

## 2022-07-14 DIAGNOSIS — G4700 Insomnia, unspecified: Secondary | ICD-10-CM | POA: Diagnosis not present

## 2022-07-14 DIAGNOSIS — F3341 Major depressive disorder, recurrent, in partial remission: Secondary | ICD-10-CM | POA: Diagnosis not present

## 2022-07-14 MED ORDER — ALPRAZOLAM 0.5 MG PO TABS: 0.5000 mg | ORAL_TABLET | Freq: Two times a day (BID) | ORAL | 1 refills | Status: DC | PRN

## 2022-07-14 NOTE — Patient Instructions (Signed)
Continue sertraline 150 mg at night  Continue bupropion 300 mg daily  Continue Trazodone 25-50 mg at night (and 25 mg during the day around chemotherapy) as needed for insomnia Continue Xanax 0.5 mg twice a day as needed for anxiety Next appointment: 1/30 at 11 AM

## 2022-08-22 ENCOUNTER — Other Ambulatory Visit: Payer: Self-pay | Admitting: Psychiatry

## 2022-08-23 ENCOUNTER — Other Ambulatory Visit: Payer: Self-pay | Admitting: Psychiatry

## 2022-08-23 DIAGNOSIS — F3341 Major depressive disorder, recurrent, in partial remission: Secondary | ICD-10-CM

## 2022-09-09 ENCOUNTER — Ambulatory Visit: Payer: Medicare Other | Admitting: Psychiatry
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
## 2022-09-10 ENCOUNTER — Encounter: Payer: Self-pay | Admitting: Psychiatry

## 2022-09-10 ENCOUNTER — Telehealth (INDEPENDENT_AMBULATORY_CARE_PROVIDER_SITE_OTHER): Payer: 59 | Admitting: Psychiatry

## 2022-09-10 DIAGNOSIS — F3341 Major depressive disorder, recurrent, in partial remission: Secondary | ICD-10-CM | POA: Diagnosis not present

## 2022-09-10 DIAGNOSIS — G4700 Insomnia, unspecified: Secondary | ICD-10-CM

## 2022-09-10 DIAGNOSIS — F411 Generalized anxiety disorder: Secondary | ICD-10-CM

## 2022-09-10 DIAGNOSIS — F4310 Post-traumatic stress disorder, unspecified: Secondary | ICD-10-CM | POA: Diagnosis not present

## 2022-09-10 MED ORDER — SERTRALINE HCL 100 MG PO TABS: 200.0000 mg | ORAL_TABLET | Freq: Every day | ORAL | 1 refills | Status: DC

## 2022-09-10 MED ORDER — ALPRAZOLAM 0.5 MG PO TABS: 0.5000 mg | ORAL_TABLET | Freq: Two times a day (BID) | ORAL | 2 refills | Status: DC | PRN

## 2022-09-10 NOTE — Patient Instructions (Signed)
Increase sertraline 200 mg at night  Continue bupropion 300 mg daily  Continue Trazodone 25-50 mg at night (and 25 mg during the day around chemotherapy) as needed for insomnia Continue Xanax 0.5 mg twice a day as needed for anxiety Next appointment: 2/28 at 3:40

## 2022-09-21 ENCOUNTER — Other Ambulatory Visit: Payer: Self-pay | Admitting: Psychiatry

## 2022-09-25 ENCOUNTER — Other Ambulatory Visit: Payer: Self-pay | Admitting: Psychiatry

## 2022-09-25 ENCOUNTER — Telehealth: Payer: Self-pay

## 2022-09-25 MED ORDER — TRAZODONE HCL 50 MG PO TABS: 25.0000 mg | ORAL_TABLET | Freq: Every evening | ORAL | 1 refills | Status: DC | PRN

## 2022-09-25 NOTE — Telephone Encounter (Signed)
Ordered

## 2022-09-25 NOTE — Telephone Encounter (Signed)
pt called states she needs a refill on the trazodone she is out. pt was seen on 08-07-22 next appt 08-23-22

## 2022-09-26 NOTE — Telephone Encounter (Signed)
left message that rx was sent to [facility].
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
## 2022-10-08 ENCOUNTER — Encounter: Payer: Self-pay | Admitting: Psychiatry

## 2022-10-08 ENCOUNTER — Telehealth (INDEPENDENT_AMBULATORY_CARE_PROVIDER_SITE_OTHER): Payer: 59 | Admitting: Psychiatry

## 2022-10-08 DIAGNOSIS — F3341 Major depressive disorder, recurrent, in partial remission: Secondary | ICD-10-CM

## 2022-10-08 DIAGNOSIS — F4310 Post-traumatic stress disorder, unspecified: Secondary | ICD-10-CM | POA: Diagnosis not present

## 2022-10-08 DIAGNOSIS — F411 Generalized anxiety disorder: Secondary | ICD-10-CM | POA: Diagnosis not present

## 2022-10-08 DIAGNOSIS — G4700 Insomnia, unspecified: Secondary | ICD-10-CM

## 2022-10-08 NOTE — Patient Instructions (Signed)
Continue sertraline 200 mg at night  Continue bupropion 300 mg daily  Continue Trazodone 25-50 mg at night  Continue Xanax 0.5 mg twice a day as needed for anxiety Next appointment: 5/6 at 3:30,

## 2022-12-10 ENCOUNTER — Encounter (INDEPENDENT_AMBULATORY_CARE_PROVIDER_SITE_OTHER): Payer: Self-pay | Admitting: *Deleted
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
## 2022-12-12 ENCOUNTER — Encounter: Payer: Self-pay | Admitting: Psychiatry

## 2022-12-12 ENCOUNTER — Telehealth (INDEPENDENT_AMBULATORY_CARE_PROVIDER_SITE_OTHER): Payer: 59 | Admitting: Psychiatry

## 2022-12-12 DIAGNOSIS — G4700 Insomnia, unspecified: Secondary | ICD-10-CM | POA: Diagnosis not present

## 2022-12-12 DIAGNOSIS — F330 Major depressive disorder, recurrent, mild: Secondary | ICD-10-CM | POA: Diagnosis not present

## 2022-12-12 DIAGNOSIS — F4310 Post-traumatic stress disorder, unspecified: Secondary | ICD-10-CM

## 2022-12-12 DIAGNOSIS — F411 Generalized anxiety disorder: Secondary | ICD-10-CM | POA: Diagnosis not present

## 2022-12-12 MED ORDER — ALPRAZOLAM 0.5 MG PO TABS
0.5000 mg | ORAL_TABLET | Freq: Two times a day (BID) | ORAL | 1 refills | Status: DC | PRN
Start: 2022-12-21 — End: 2023-02-11

## 2022-12-15 ENCOUNTER — Ambulatory Visit: Payer: 59 | Admitting: Psychiatry

## 2022-12-19 ENCOUNTER — Other Ambulatory Visit: Payer: Self-pay | Admitting: Psychiatry

## 2023-01-08 ENCOUNTER — Institutional Professional Consult (permissible substitution): Payer: 59 | Admitting: Emergency Medicine

## 2023-01-10 DIAGNOSIS — R918 Other nonspecific abnormal finding of lung field: Secondary | ICD-10-CM

## 2023-01-10 HISTORY — DX: Other nonspecific abnormal finding of lung field: R91.8

## 2023-01-15 ENCOUNTER — Institutional Professional Consult (permissible substitution): Payer: 59 | Admitting: Pulmonary Disease

## 2023-01-15 ENCOUNTER — Encounter: Payer: Self-pay | Admitting: Pulmonary Disease

## 2023-01-15 ENCOUNTER — Ambulatory Visit (INDEPENDENT_AMBULATORY_CARE_PROVIDER_SITE_OTHER): Payer: 59 | Admitting: Pulmonary Disease

## 2023-01-15 VITALS — BP 120/90 | HR 85 | Ht 59.0 in | Wt 76.0 lb

## 2023-01-15 DIAGNOSIS — Z8589 Personal history of malignant neoplasm of other organs and systems: Secondary | ICD-10-CM

## 2023-01-15 DIAGNOSIS — R599 Enlarged lymph nodes, unspecified: Secondary | ICD-10-CM

## 2023-01-15 DIAGNOSIS — R918 Other nonspecific abnormal finding of lung field: Secondary | ICD-10-CM
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
## 2023-01-16 ENCOUNTER — Other Ambulatory Visit: Payer: Self-pay

## 2023-01-16 ENCOUNTER — Encounter (HOSPITAL_COMMUNITY): Payer: Self-pay | Admitting: Pulmonary Disease

## 2023-01-16 NOTE — Progress Notes (Signed)
Chest x-ray - n/a EKG - n/a Stress Test - n/a ECHO - n/a Cardiac Cath - n/a  ICD Pacemaker/Loop - n/a  Sleep Study -  n/a CPAP - none  Diabetes Type - n/a  NPO  STOP now taking any Aspirin (unless otherwise instructed by your surgeon), Aleve, Naproxen, Ibuprofen, Motrin, Advil, Goody's, BC's, all herbal medications, fish oil, and all vitamins.   Coronavirus Screening Do you have any of the following symptoms:  Cough yes/no: No Fever (>100.40F)  yes/no: No Runny nose yes/no: No Sore throat yes/no: No Difficulty breathing/shortness of breath  yes/no: No  Have you traveled in the last 14 days and where? yes/no: No  Patient verbalized understanding of instructions that were given via phone.

## 2023-01-20 ENCOUNTER — Other Ambulatory Visit: Payer: Self-pay

## 2023-01-20 ENCOUNTER — Ambulatory Visit (HOSPITAL_COMMUNITY): Payer: 59

## 2023-01-20 ENCOUNTER — Encounter (HOSPITAL_COMMUNITY): Payer: Self-pay | Admitting: Pulmonary Disease

## 2023-01-20 ENCOUNTER — Ambulatory Visit (HOSPITAL_COMMUNITY)
Admission: RE | Admit: 2023-01-20 | Discharge: 2023-01-20 | Disposition: A | Discharge status: Home / Self Care | Payer: 59 | Source: Ambulatory Visit | Attending: Pulmonary Disease | Admitting: Pulmonary Disease

## 2023-01-20 ENCOUNTER — Ambulatory Visit (HOSPITAL_BASED_OUTPATIENT_CLINIC_OR_DEPARTMENT_OTHER): Payer: 59 | Admitting: Anesthesiology

## 2023-01-20 ENCOUNTER — Ambulatory Visit (HOSPITAL_COMMUNITY): Payer: 59 | Admitting: Anesthesiology

## 2023-01-20 ENCOUNTER — Encounter (HOSPITAL_COMMUNITY)
Admission: RE | Disposition: A | Discharge status: Home / Self Care | Payer: Self-pay | Source: Ambulatory Visit | Attending: Pulmonary Disease

## 2023-01-20 DIAGNOSIS — F17210 Nicotine dependence, cigarettes, uncomplicated: Secondary | ICD-10-CM | POA: Insufficient documentation

## 2023-01-20 DIAGNOSIS — F319 Bipolar disorder, unspecified: Secondary | ICD-10-CM | POA: Diagnosis not present

## 2023-01-20 DIAGNOSIS — E039 Hypothyroidism, unspecified: Secondary | ICD-10-CM | POA: Insufficient documentation

## 2023-01-20 DIAGNOSIS — J449 Chronic obstructive pulmonary disease, unspecified: Secondary | ICD-10-CM

## 2023-01-20 DIAGNOSIS — I10 Essential (primary) hypertension: Secondary | ICD-10-CM | POA: Diagnosis not present

## 2023-01-20 DIAGNOSIS — R599 Enlarged lymph nodes, unspecified: Secondary | ICD-10-CM | POA: Diagnosis not present

## 2023-01-20 DIAGNOSIS — Z79899 Other long term (current) drug therapy: Secondary | ICD-10-CM | POA: Diagnosis not present

## 2023-01-20 DIAGNOSIS — C7A1 Malignant poorly differentiated neuroendocrine tumors: Secondary | ICD-10-CM | POA: Diagnosis not present

## 2023-01-20 DIAGNOSIS — Z79890 Hormone replacement therapy (postmenopausal): Secondary | ICD-10-CM | POA: Insufficient documentation

## 2023-01-20 DIAGNOSIS — C3492 Malignant neoplasm of unspecified part of left bronchus or lung: Secondary | ICD-10-CM | POA: Diagnosis not present

## 2023-01-20 DIAGNOSIS — R918 Other nonspecific abnormal finding of lung field: Secondary | ICD-10-CM | POA: Insufficient documentation

## 2023-01-20 DIAGNOSIS — K219 Gastro-esophageal reflux disease without esophagitis: Secondary | ICD-10-CM | POA: Insufficient documentation

## 2023-01-20 DIAGNOSIS — C3412 Malignant neoplasm of upper lobe, left bronchus or lung: Secondary | ICD-10-CM

## 2023-01-20 DIAGNOSIS — C771 Secondary and unspecified malignant neoplasm of intrathoracic lymph nodes: Secondary | ICD-10-CM | POA: Diagnosis not present

## 2023-01-20 HISTORY — DX: Bipolar disorder, unspecified: F31.9

## 2023-01-20 HISTORY — PX: VIDEO BRONCHOSCOPY WITH ENDOBRONCHIAL ULTRASOUND: SHX6177

## 2023-01-20 HISTORY — DX: Post-traumatic stress disorder, unspecified: F43.10

## 2023-01-20 HISTORY — PX: BRONCHIAL BIOPSY: SHX5109

## 2023-01-20 HISTORY — DX: Hypothyroidism, unspecified: E03.9

## 2023-01-20 HISTORY — PX: FINE NEEDLE ASPIRATION: SHX5430

## 2023-01-20 HISTORY — PX: BRONCHIAL BRUSHINGS: SHX5108

## 2023-01-20 HISTORY — DX: Liver cell carcinoma: C22.0

## 2023-01-20 LAB — CBC
HCT: 39.7 % (ref 36.0–46.0)
Hemoglobin: 12.9 g/dL (ref 12.0–15.0)
MCH: 31.9 pg (ref 26.0–34.0)
MCHC: 32.5 g/dL (ref 30.0–36.0)
MCV: 98.3 fL (ref 80.0–100.0)
Platelets: 214 10*3/uL (ref 150–400)
RBC: 4.04 MIL/uL (ref 3.87–5.11)
RDW: 12.9 % (ref 11.5–15.5)
WBC: 4.2 10*3/uL (ref 4.0–10.5)
nRBC: 0 % (ref 0.0–0.2)

## 2023-01-20 LAB — BASIC METABOLIC PANEL
Anion gap: 16 — ABNORMAL HIGH (ref 5–15)
BUN: 13 mg/dL (ref 6–20)
CO2: 22 mmol/L (ref 22–32)
Calcium: 9.1 mg/dL (ref 8.9–10.3)
Chloride: 103 mmol/L (ref 98–111)
Creatinine, Ser: 0.77 mg/dL (ref 0.44–1.00)
GFR, Estimated: >60 mL/min (ref >60)
Glucose, Bld: 74 mg/dL (ref 70–99)
Potassium: 3.6 mmol/L (ref 3.5–5.1)
Sodium: 141 mmol/L (ref 135–145)

## 2023-01-20 SURGERY — BRONCHOSCOPY, WITH EBUS
Anesthesia: General | Laterality: Bilateral

## 2023-01-20 MED ORDER — CHLORHEXIDINE GLUCONATE 0.12 % MT SOLN
15.0000 mL | Freq: Once | OROMUCOSAL | Status: AC
  Administered 2023-01-20: 15 mL via OROMUCOSAL

## 2023-01-20 MED ORDER — LIDOCAINE 2% (20 MG/ML) 5 ML SYRINGE
INTRAMUSCULAR | Status: DC | PRN
  Administered 2023-01-20: 40 mg via INTRAVENOUS

## 2023-01-20 MED ORDER — SUCCINYLCHOLINE CHLORIDE 200 MG/10ML IV SOSY
PREFILLED_SYRINGE | INTRAVENOUS | Status: DC | PRN
  Administered 2023-01-20: 60 mg via INTRAVENOUS

## 2023-01-20 MED ORDER — PHENYLEPHRINE 80 MCG/ML (10ML) SYRINGE FOR IV PUSH (FOR BLOOD PRESSURE SUPPORT)
PREFILLED_SYRINGE | INTRAVENOUS | Status: DC | PRN
  Administered 2023-01-20: 160 ug via INTRAVENOUS

## 2023-01-20 MED ORDER — PROPOFOL 10 MG/ML IV BOLUS
INTRAVENOUS | Status: DC | PRN
  Administered 2023-01-20: 100 mg via INTRAVENOUS

## 2023-01-20 MED ORDER — CHLORHEXIDINE GLUCONATE 0.12 % MT SOLN
OROMUCOSAL | Status: AC
  Filled 2023-01-20: qty 15

## 2023-01-20 MED ORDER — SUGAMMADEX SODIUM 200 MG/2ML IV SOLN
INTRAVENOUS | Status: DC | PRN
  Administered 2023-01-20: 100 mg via INTRAVENOUS

## 2023-01-20 MED ORDER — MIDAZOLAM HCL 2 MG/2ML IJ SOLN
INTRAMUSCULAR | Status: DC | PRN
  Administered 2023-01-20: 2 mg via INTRAVENOUS

## 2023-01-20 MED ORDER — ONDANSETRON HCL 4 MG/2ML IJ SOLN
INTRAMUSCULAR | Status: DC | PRN
  Administered 2023-01-20: 4 mg via INTRAVENOUS

## 2023-01-20 MED ORDER — FENTANYL CITRATE (PF) 100 MCG/2ML IJ SOLN
INTRAMUSCULAR | Status: AC
  Filled 2023-01-20: qty 2

## 2023-01-20 MED ORDER — DEXAMETHASONE SODIUM PHOSPHATE 10 MG/ML IJ SOLN
INTRAMUSCULAR | Status: DC | PRN
  Administered 2023-01-20: 10 mg via INTRAVENOUS

## 2023-01-20 MED ORDER — LACTATED RINGERS IV SOLN: INTRAVENOUS | Status: DC

## 2023-01-20 MED ORDER — FENTANYL CITRATE (PF) 250 MCG/5ML IJ SOLN
INTRAMUSCULAR | Status: DC | PRN
  Administered 2023-01-20 (×2): 25 ug via INTRAVENOUS
  Administered 2023-01-20: 50 ug via INTRAVENOUS

## 2023-01-20 MED ORDER — ROCURONIUM BROMIDE 10 MG/ML (PF) SYRINGE
PREFILLED_SYRINGE | INTRAVENOUS | Status: DC | PRN
  Administered 2023-01-20: 20 mg via INTRAVENOUS

## 2023-01-20 MED ORDER — ESMOLOL HCL 100 MG/10ML IV SOLN
INTRAVENOUS | Status: DC | PRN
  Administered 2023-01-20: 10 mg via INTRAVENOUS

## 2023-01-20 SURGICAL SUPPLY — 29 items

## 2023-01-20 NOTE — Transfer of Care (Signed)
Immediate Anesthesia Transfer of Care Note  Patient: Jamie Salinas  Procedure(s) Performed: VIDEO BRONCHOSCOPY WITH ENDOBRONCHIAL ULTRASOUND (Bilateral) BRONCHIAL BIOPSIES FINE NEEDLE ASPIRATION (FNA) LINEAR BRONCHIAL BRUSHINGS  Patient Location: PACU  Anesthesia Type:General  Level of Consciousness: awake, alert , and oriented  Airway & Oxygen Therapy: Patient Spontanous Breathing  Post-op Assessment: Report given to RN, Post -op Vital signs reviewed and stable, and Patient moving all extremities  Post vital signs: stable  Last Vitals:  Vitals Value Taken Time  BP 119/78 01/20/23 1640  Temp 36.6 C 01/20/23 1625  Pulse 98 01/20/23 1641  Resp 22 01/20/23 1641  SpO2 92 % 01/20/23 1641  Vitals shown include unvalidated device data.  Last Pain:  Vitals:   01/20/23 1628  TempSrc:   PainSc: 0-No pain         Complications: No notable events documented.

## 2023-01-20 NOTE — Interval H&P Note (Signed)
History and Physical Interval Note:  01/20/2023 2:32 PM  Jamie Salinas  has presented today for surgery, with the diagnosis of adenopathy.  The various methods of treatment have been discussed with the patient and family. After consideration of risks, benefits and other options for treatment, the patient has consented to  Procedure(s): VIDEO BRONCHOSCOPY WITH ENDOBRONCHIAL ULTRASOUND (Bilateral) as a surgical intervention.  The patient's history has been reviewed, patient examined, no change in status, stable for surgery.  I have reviewed the patient's chart and labs.  Questions were answered to the patient's satisfaction.     Rachel Bo Safiyyah Vasconez

## 2023-01-20 NOTE — Anesthesia Postprocedure Evaluation (Signed)
Anesthesia Post Note  Patient: Jamie Salinas  Procedure(s) Performed: VIDEO BRONCHOSCOPY WITH ENDOBRONCHIAL ULTRASOUND (Bilateral) BRONCHIAL BIOPSIES FINE NEEDLE ASPIRATION (FNA) LINEAR BRONCHIAL BRUSHINGS     Patient location during evaluation: PACU Anesthesia Type: General Level of consciousness: awake and alert Pain management: pain level controlled Vital Signs Assessment: post-procedure vital signs reviewed and stable Respiratory status: spontaneous breathing, nonlabored ventilation, respiratory function stable and patient connected to nasal cannula oxygen Cardiovascular status: blood pressure returned to baseline and stable Postop Assessment: no apparent nausea or vomiting Anesthetic complications: no  No notable events documented.  Last Vitals:  Vitals:   01/20/23 1720 01/20/23 1725  BP: 110/78 112/80  Pulse: 92 95  Resp: 19 19  Temp:    SpO2: (!) 89% 93%    Last Pain:  Vitals:   01/20/23 1725  TempSrc:   PainSc: 0-No pain                 Shelton Silvas

## 2023-01-20 NOTE — Anesthesia Preprocedure Evaluation (Addendum)
Anesthesia Evaluation  Patient identified by MRN, date of birth, ID band Patient awake    Reviewed: Allergy & Precautions, H&P , NPO status , Patient's Chart, lab work & pertinent test results  History of Anesthesia Complications (+) PONV and history of anesthetic complications  Airway Mallampati: II  TM Distance: >3 FB Neck ROM: Full    Dental  (+) Edentulous Upper, Edentulous Lower   Pulmonary asthma , COPD,  COPD inhaler, Current Smoker and Patient abstained from smoking.   + rhonchi        Cardiovascular hypertension, Pt. on medications  Rhythm:Regular     Neuro/Psych   Anxiety Depression Bipolar Disorder   negative neurological ROS  negative psych ROS   GI/Hepatic Neg liver ROS,GERD  ,,  Endo/Other  Hypothyroidism    Renal/GU negative Renal ROS  negative genitourinary   Musculoskeletal negative musculoskeletal ROS (+)    Abdominal   Peds negative pediatric ROS (+)  Hematology negative hematology ROS (+)   Anesthesia Other Findings All: Amoxicillin  Reproductive/Obstetrics negative OB ROS                             Anesthesia Physical Anesthesia Plan  ASA: III  Anesthesia Plan: General   Post-op Pain Management:    Induction: Intravenous  PONV Risk Score and Plan: Treatment may vary due to age or medical condition, Ondansetron, Dexamethasone and Midazolam  Airway Management Planned: Oral ETT  Additional Equipment:   Intra-op Plan:   Post-operative Plan: Extubation in OR  Informed Consent: I have reviewed the patients History and Physical, chart, labs and discussed the procedure including the risks, benefits and alternatives for the proposed anesthesia with the patient or authorized representative who has indicated his/her understanding and acceptance.     Dental advisory given  Plan Discussed with:   Anesthesia Plan Comments:         Anesthesia Quick  Evaluation

## 2023-01-20 NOTE — Discharge Instructions (Signed)

## 2023-01-20 NOTE — Anesthesia Procedure Notes (Signed)
Procedure Name: Intubation Date/Time: 01/20/2023 3:50 PM  Performed by: Tressia Miners, CRNAPre-anesthesia Checklist: Patient identified, Emergency Drugs available, Suction available, Patient being monitored and Timeout performed Patient Re-evaluated:Patient Re-evaluated prior to induction Oxygen Delivery Method: Circle system utilized Preoxygenation: Pre-oxygenation with 100% oxygen Induction Type: IV induction Ventilation: Mask ventilation without difficulty Laryngoscope Size: Mac and 3 Grade View: Grade I Tube type: Oral Tube size: 8.0 mm Number of attempts: 1 Airway Equipment and Method: Stylet Placement Confirmation: ETT inserted through vocal cords under direct vision, positive ETCO2, CO2 detector and breath sounds checked- equal and bilateral Secured at: 19 cm Tube secured with: Tape Dental Injury: Teeth and Oropharynx as per pre-operative assessment

## 2023-01-20 NOTE — Op Note (Signed)
Video Bronchoscopy with Endobronchial Ultrasound Procedure Note  Date of Operation: 01/20/2023  Pre-op Diagnosis: Lung nodules, Lung mass, adenopathy   Post-op Diagnosis: Lung nodules, lung mass, adenopathy   Surgeon: Josephine Igo, DO   Assistants: None   Anesthesia: General endotracheal anesthesia  Operation: Flexible video fiberoptic bronchoscopy with endobronchial ultrasound and biopsies.  Estimated Blood Loss: Minimal  Complications: none   Indications and History: Jamie Salinas is a 57 y.o. female with history of malignancy now with PET avid adenopathy and left upper lobe lung lesion.  The risks, benefits, complications, treatment options and expected outcomes were discussed with the patient.  The possibilities of pneumothorax, pneumonia, reaction to medication, pulmonary aspiration, perforation of a viscus, bleeding, failure to diagnose a condition and creating a complication requiring transfusion or operation were discussed with the patient who freely signed the consent.    Description of Procedure: The patient was examined in the preoperative area and history and data from the preprocedure consultation were reviewed. It was deemed appropriate to proceed.  The patient was taken to [facility] endoscopy room 3, identified as [name] and the procedure verified as Flexible Video Fiberoptic Bronchoscopy.  A Time Out was held and the above information confirmed. After being taken to the operating room general anesthesia was initiated and the patient  was orally intubated. The video fiberoptic bronchoscope was introduced via the endotracheal tube and a general inspection was performed which showed abnormal irregular mucosa in the left upper lobe and otherwise clear airways, she does have an accessory right upper lobe coming off of the distal trachea.. The standard scope was then withdrawn and the endobronchial ultrasound was used to identify and characterize the peritracheal,  hilar and bronchial lymph nodes. Inspection showed enlarged prevascular 4L. Using real-time ultrasound guidance Wang needle biopsies were take from Station 4L nodes and were sent for cytology.  Following that we switched back to a therapeutic bronchoscope.  We then used a cytology brush to take specimens from the left upper lobe.  Because we felt like the needle aspirates from the node were not adequate enough for cellular material.  We then used the forceps to obtain transbronchial biopsies from the left upper lobe irregular mucosa.  The patient tolerated the procedure well without apparent complications. There was no significant blood loss. The bronchoscope was withdrawn. Anesthesia was reversed and the patient was taken to the PACU for recovery.   Samples: 1. Wang needle biopsies from 4L node 2.  Left upper lobe cytology brushing 3.  Left upper lobe transbronchial/ endobronchial biopsies.  Plans:  The patient will be discharged from the PACU to home when recovered from anesthesia. We will review the cytology, pathology results with the patient when they become available. Outpatient followup will be with Josephine Igo, DO.    Josephine Igo, DO [facility] 01/20/2023 4:13 PM

## 2023-01-20 NOTE — Progress Notes (Signed)
This RN spoke with radiologist Loralie Champagne over phone about this pt stat chest XR. Provider stated no pneumothorax.

## 2023-01-21 ENCOUNTER — Encounter: Payer: Self-pay | Admitting: Gastroenterology

## 2023-01-21 ENCOUNTER — Other Ambulatory Visit: Payer: Self-pay | Admitting: Psychiatry

## 2023-01-21 ENCOUNTER — Telehealth: Payer: Self-pay | Admitting: *Deleted

## 2023-01-21 ENCOUNTER — Encounter: Payer: Self-pay | Admitting: *Deleted

## 2023-01-21 ENCOUNTER — Ambulatory Visit (INDEPENDENT_AMBULATORY_CARE_PROVIDER_SITE_OTHER): Payer: 59 | Admitting: Gastroenterology

## 2023-01-21 VITALS — BP 128/71 | HR 97 | Temp 97.8°F | Ht 59.0 in | Wt 76.0 lb

## 2023-01-21 DIAGNOSIS — Z1211 Encounter for screening for malignant neoplasm of colon: Secondary | ICD-10-CM | POA: Diagnosis not present

## 2023-01-21 DIAGNOSIS — R1013 Epigastric pain: Secondary | ICD-10-CM | POA: Diagnosis not present

## 2023-01-21 DIAGNOSIS — F330 Major depressive disorder, recurrent, mild: Secondary | ICD-10-CM

## 2023-01-21 MED ORDER — PEG 3350-KCL-NA BICARB-NACL 420 G PO SOLR: 4000.0000 mL | Freq: Once | ORAL | 0 refills | Status: AC
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
## 2023-01-21 NOTE — Patient Instructions (Signed)
We are arranging an upper endoscopy and colonoscopy with Dr. Marletta Lor in the near future.  I recommend taking Miralax 1 capful daily in 8 ounces of water.  Further recommendations to follow!  It was a pleasure to see you today. I want to create trusting relationships with patients and provide genuine, compassionate, and quality care. I truly value your feedback, so please be on the lookout for a survey regarding your visit with me today. I appreciate your time in completing this!         Gelene Mink, PhD, ANP-BC [facility] Gastroenterology

## 2023-01-21 NOTE — Telephone Encounter (Signed)
Per [facility] website. No PA required for TCS/EGD You are not required to submit a notification/prior authorization based on the information provided. If you have general questions about the prior authorization requirements, visit [email or web address] > Clinician Resources > Advance and Admission Notification Requirements. The number above acknowledges your notification. Please write this reference number down for future reference. If you would like to request an organization determination, please call us at [phone]. Decision ID #: [id number]

## 2023-01-23 LAB — CYTOLOGY - NON PAP

## 2023-01-25 ENCOUNTER — Encounter (HOSPITAL_COMMUNITY): Payer: Self-pay | Admitting: Pulmonary Disease
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
## 2023-01-26 NOTE — Progress Notes (Signed)
Maralyn Sago, she is seeing you tomorrrow. She will need medical oncology appt ASAP Path + high grade neuroendocrine. They suspect large cell.   Thanks,  BLI  Josephine Igo, DO [facility] 01/26/2023 11:54 AM
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
## 2023-01-27 ENCOUNTER — Encounter (HOSPITAL_COMMUNITY)
Admission: RE | Admit: 2023-01-27 | Discharge: 2023-01-27 | Disposition: A | Discharge status: Home / Self Care | Payer: 59 | Source: Ambulatory Visit | Attending: Internal Medicine | Admitting: Internal Medicine

## 2023-01-27 ENCOUNTER — Telehealth: Payer: Self-pay | Admitting: *Deleted

## 2023-01-27 ENCOUNTER — Ambulatory Visit (INDEPENDENT_AMBULATORY_CARE_PROVIDER_SITE_OTHER): Payer: 59 | Admitting: Acute Care

## 2023-01-27 ENCOUNTER — Encounter: Payer: Self-pay | Admitting: Acute Care

## 2023-01-27 VITALS — BP 132/80 | HR 82 | Temp 97.9°F | Ht 59.0 in | Wt 76.2 lb

## 2023-01-27 DIAGNOSIS — Z859 Personal history of malignant neoplasm, unspecified: Secondary | ICD-10-CM

## 2023-01-27 DIAGNOSIS — C3492 Malignant neoplasm of unspecified part of left bronchus or lung: Secondary | ICD-10-CM | POA: Diagnosis not present

## 2023-01-27 DIAGNOSIS — F17200 Nicotine dependence, unspecified, uncomplicated: Secondary | ICD-10-CM

## 2023-01-27 NOTE — Telephone Encounter (Signed)
-----   Message from [name], RN sent at 01/27/2023  4:20 PM EDT ----- Regarding: cancel Hey ladies! Jamie Salinas just called me and said that her oncologist wants her to not have her procedure right now. She will call you guys back when he is ready for her to have to procedure. I will put her in the depot for now.

## 2023-01-27 NOTE — Telephone Encounter (Signed)
FYI

## 2023-01-27 NOTE — Pre-Procedure Instructions (Signed)
  Message Received: Today [name], RN  Estudillo, Ewell Poe, CMA; Elinor Dodge, LPN; Marlowe Shores, LPN Hey ladies! I just attempted to do Jena Gauss preop phone call. Her daughter, Scarlette Slice was with patient and became very Irate that they did not have any information on the patients prep. I started going over the prep instructions with her, she again got upset that "she will have to go so long without eating". I told her this was routine for all preps. I asked her if she wanted to come by here or the office to get a copy of instructions  and she states, "we did not have this planned in our day. No one has told me anything we needed to do for this day except wait for this phone call. We didn't know she had to stop eating today". I explained that a letter had been sent out and she states, "what do I need to do right now". I told her in order for patient to have her procedure on 6/20, she needed a copy of the letter to know what to do and she hung up on me. Just wanted to give you a heads up.

## 2023-01-27 NOTE — Patient Instructions (Addendum)
It is good to see you today. 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  This is a different type  of tumor than you have had in the past. We will send you back to see Dr. Harvie Bridge for treatment at [facility] in [place].  He will get you set up for treatment. I am checking on molecular markers to share with your doctor.  Follow up with [place] as needed.  Work on quitting smoking. Call 1-800-QUIT NOW for free nicotine patches, gum or mints Follow up as needed with [place]. Good luck with your treatment.  Please contact office for sooner follow up if symptoms do not improve or worsen or seek emergency care

## 2023-01-27 NOTE — Pre-Procedure Instructions (Signed)
Attempted pre-op phonecall. Left VM for her to call us back. 

## 2023-01-28 ENCOUNTER — Telehealth: Payer: 59 | Admitting: Psychiatry

## 2023-01-28 ENCOUNTER — Telehealth (INDEPENDENT_AMBULATORY_CARE_PROVIDER_SITE_OTHER): Payer: Self-pay

## 2023-01-28 LAB — CYTOLOGY - NON PAP

## 2023-01-28 NOTE — Telephone Encounter (Signed)
Voicemail from Christine(RN,Pallative Care) stating that pt has 4th cancer diagnosis and oncologist is recommending she not have TCS. Wynona Canes also stated that daughter/family wanted to know if they would be charged for TCS. I have left a voicemail at pre service for a return to call to either me or patient. Left message on Wynona Canes voicemail making her aware that I did receive message and that I have left message with pre service and that pt has been cancelled.

## 2023-01-28 NOTE — Telephone Encounter (Signed)
Bobbe Medico Np from  [facility] [place] called and is asking for a return call from you at [phone] or her Cell# [phone]. She says she had been the one whom cancelled the patient's up coming procedures as she thought the were for cancer screening and they have already determined the patient has a form of cancer. She says she wanted to discuss this case with you. Please call one of the above numbers. I did advise that you were out of the office until the week of February 02, 2023 and that we would have you reach out at that time and she was fine with this as she felt it could wait until you were back. Thanks      Voicemail from Christine(RN,Pallative Care) stating that pt has 4th cancer diagnosis and oncologist is recommending she not have TCS. Wynona Canes also stated that daughter/family wanted to know if they would be charged for TCS. I have left a voicemail at pre service for a return to call to either me or patient. Left message on Wynona Canes voicemail making her aware that I did receive message and that I have left message with pre service and that pt has been cancelled.

## 2023-01-28 NOTE — Telephone Encounter (Signed)
Kim from Pre Service returned call and left voicemail stating that pt will not be charged for procedure since she has not came into the facility for procedure yet. Contacted daughter and made her aware

## 2023-01-29 ENCOUNTER — Encounter (HOSPITAL_COMMUNITY): Admission: RE | Payer: Self-pay | Source: Ambulatory Visit

## 2023-01-29 ENCOUNTER — Ambulatory Visit (HOSPITAL_COMMUNITY): Admission: RE | Admit: 2023-01-29 | Payer: 59 | Source: Ambulatory Visit

## 2023-01-29 SURGERY — COLONOSCOPY WITH PROPOFOL
Anesthesia: Monitor Anesthesia Care

## 2023-02-02 ENCOUNTER — Telehealth: Payer: Self-pay | Admitting: *Deleted

## 2023-02-02 NOTE — Progress Notes (Signed)
Results for Guardant 360CDx faxed to Gearldine Bienenstock, Statistician at [facility] at [phone]. This NN informed Gearldine Bienenstock that the tissue sample for Guardant has not arrived or has not been sent. NN let Gearldine Bienenstock know that she will reach out to [facility] to see if tissue has been sent.

## 2023-02-02 NOTE — Telephone Encounter (Signed)
Received VM from daughter stating she feels patient needs to have procedures done and she wants them done within next 2 weeks. If you see prior notes it is being recommended this not be done.please advise Tobi Bastos thanks!
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
## 2023-02-03 ENCOUNTER — Telehealth: Payer: 59 | Admitting: Psychiatry

## 2023-02-04 NOTE — Telephone Encounter (Signed)
Spoke with pt daughter and she is aware of recs per Tobi Bastos. Pt has been scheduled for EGD with Dr. Marletta Lor 7/12. Aware will send instructions/pre-op to mychart.

## 2023-02-04 NOTE — Telephone Encounter (Signed)
Received another transferred VM from daughter. She was not happy as she is wanting pt to be rescheduled for procedure ASAP and no one has called her and wants a call back today!

## 2023-02-04 NOTE — Telephone Encounter (Signed)
Spoke with daughter. She stated she spoke with pt oncologists and reports he was agreeable for her to have procedure done since patient was having a lot of stomach pain, not able to eat, having hard time using bathroom. They just want answers and reason wanting her to have procedure done to see what's causing her stomach issues and doesn't feel her issues are related to her oncology issues. Please advise.

## 2023-02-04 NOTE — Telephone Encounter (Signed)
I have reviewed all phone calls and documentation since visit. I believe right now in light of palliative treatment, it would be best to do EGD ONLY due to dyspepsia. Pursuing colonoscopy will not change her clinical course.   Let's do EGD only.

## 2023-02-05 ENCOUNTER — Encounter: Payer: Self-pay | Admitting: *Deleted

## 2023-02-06 ENCOUNTER — Encounter (HOSPITAL_COMMUNITY): Payer: Self-pay

## 2023-02-07 NOTE — Progress Notes (Signed)
Pathology reviewed by SG in office appt   Thanks,  BLI  Josephine Igo, DO [facility] 02/07/2023 3:00 PM

## 2023-02-09 ENCOUNTER — Ambulatory Visit (INDEPENDENT_AMBULATORY_CARE_PROVIDER_SITE_OTHER): Payer: Medicare Other | Admitting: Gastroenterology

## 2023-02-11 ENCOUNTER — Telehealth: Payer: Self-pay

## 2023-02-11 ENCOUNTER — Other Ambulatory Visit: Payer: Self-pay | Admitting: Psychiatry

## 2023-02-11 DIAGNOSIS — F330 Major depressive disorder, recurrent, mild: Secondary | ICD-10-CM

## 2023-02-11 MED ORDER — ALPRAZOLAM 0.5 MG PO TABS
0.50 mg | ORAL_TABLET | Freq: Three times a day (TID) | ORAL | 0 refills | Status: DC | PRN
Start: 2023-02-11 — End: 2023-02-17

## 2023-02-11 NOTE — Telephone Encounter (Signed)
Medication refill - Telephone call with patient after she requested a call back to inform Dr. Vanetta Shawl she started radiation for her cancer spots located on her liver, spine and lungs 2 week back and "is having a hard time". Patient stated she started taking a third Alprazolam a day, as she stated they discussed when she started radiation as that is what helps keep her calm for the procedure.  Patient stated she now only has 4 remaining and was scheduled to see Dr. Vanetta Shawl on 02/09/23 but was rescheduled due to some computer problems that day until 02/17/23.  Patient requests a new Alprazolam and Trazodone order [facility] prior to 02/17/23 so she will not run completely out as counts on these for sleep and anxiety.  Patient's last order for Alprazolam was on 12/21/22 for #60 of the 0.5 mg tablets and last Trazodone was for #135 for the 50 mg on 09/25/22.

## 2023-02-11 NOTE — Telephone Encounter (Signed)
Spoke to Jamie Salinas at the pharmacy he stated that she did pick up a 30 day supply of the Xanax on 01/21/23 and the earliest she could refill the Xanax would be 02/20/23 patient is having a hard time she has started radiation and is taking 3 per day now if the Xanax is increased a new Rx can be sent to the pharmacy so that she can have this medication due to it helping her with the radiation please also read Jamie Salinas note He also stated that she picked up the Trazodone  45 tablets on 01/18/23 please advise
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
## 2023-02-11 NOTE — Telephone Encounter (Signed)
Spoke with the pharmacy. They do have a trazodone refill she can pick up. I ordered Xanax so that she can take it up to three times a day. Please contact and update the patient regarding the above. Please also advise her not to take Xanax more than three times a day, as I will not be able to provide an early refill anymore.

## 2023-02-11 NOTE — Telephone Encounter (Signed)
Could you contact the pharmacy to verify? According to the database, she filled Xanax on 01/21/2023 for 30 days, so she would not be able to refill the medication before 07/09. Also, per Epic, she should have enough Trazodone to last until August, as the refill was sent in when it was ordered in February.

## 2023-02-11 NOTE — Telephone Encounter (Signed)
Agreed, thank you

## 2023-02-11 NOTE — Telephone Encounter (Signed)
Spoke to patient made her aware of the message she voiced understanding

## 2023-02-12 ENCOUNTER — Other Ambulatory Visit: Payer: Self-pay | Admitting: Psychiatry

## 2023-02-12 DIAGNOSIS — F330 Major depressive disorder, recurrent, mild: Secondary | ICD-10-CM

## 2023-02-13 ENCOUNTER — Encounter (HOSPITAL_COMMUNITY): Payer: Self-pay

## 2023-02-17 ENCOUNTER — Encounter (HOSPITAL_COMMUNITY)
Admission: RE | Admit: 2023-02-17 | Discharge: 2023-02-17 | Disposition: A | Discharge status: Home / Self Care | Payer: 59 | Source: Ambulatory Visit | Attending: Internal Medicine | Admitting: Internal Medicine

## 2023-02-17 ENCOUNTER — Telehealth (INDEPENDENT_AMBULATORY_CARE_PROVIDER_SITE_OTHER): Payer: 59 | Admitting: Psychiatry

## 2023-02-17 ENCOUNTER — Encounter: Payer: Self-pay | Admitting: Psychiatry

## 2023-02-17 DIAGNOSIS — G4700 Insomnia, unspecified: Secondary | ICD-10-CM | POA: Diagnosis not present

## 2023-02-17 DIAGNOSIS — F4310 Post-traumatic stress disorder, unspecified: Secondary | ICD-10-CM | POA: Diagnosis not present

## 2023-02-17 DIAGNOSIS — F411 Generalized anxiety disorder: Secondary | ICD-10-CM | POA: Diagnosis not present

## 2023-02-17 DIAGNOSIS — F330 Major depressive disorder, recurrent, mild: Secondary | ICD-10-CM

## 2023-02-17 MED ORDER — ALPRAZOLAM 0.5 MG PO TABS
0.50 mg | ORAL_TABLET | Freq: Three times a day (TID) | ORAL | 1 refills | Status: AC | PRN
Start: 2023-03-13 — End: 2023-05-12

## 2023-02-17 MED ORDER — TRAZODONE HCL 50 MG PO TABS: 25.0000 mg | ORAL_TABLET | Freq: Every evening | ORAL | 1 refills | Status: AC | PRN

## 2023-02-17 MED ORDER — SERTRALINE HCL 100 MG PO TABS
200.00 mg | ORAL_TABLET | Freq: Every day | ORAL | 1 refills | Status: AC
Start: 2023-03-09 — End: 2023-09-05

## 2023-02-17 NOTE — Patient Instructions (Signed)
Continue sertraline 200 mg at night  Continue bupropion 300 mg daily  Continue Trazodone 25-50 mg at night as needed for insomnia Continue Xanax 0.5 mg three times a day for anxiety  Next appointment: 9/11 at 9:30

## 2023-02-18 ENCOUNTER — Encounter (HOSPITAL_COMMUNITY): Payer: Self-pay

## 2023-02-18 ENCOUNTER — Other Ambulatory Visit: Payer: Self-pay

## 2023-02-19 ENCOUNTER — Encounter (HOSPITAL_COMMUNITY): Payer: Self-pay | Admitting: Anesthesiology

## 2023-02-20 ENCOUNTER — Ambulatory Visit (HOSPITAL_COMMUNITY): Admission: RE | Admit: 2023-02-20 | Payer: 59 | Source: Ambulatory Visit

## 2023-02-20 ENCOUNTER — Encounter (HOSPITAL_COMMUNITY): Admission: RE | Payer: Self-pay | Source: Ambulatory Visit

## 2023-02-20 DIAGNOSIS — R1013 Epigastric pain: Secondary | ICD-10-CM

## 2023-02-20 SURGERY — ESOPHAGOGASTRODUODENOSCOPY (EGD) WITH PROPOFOL
Anesthesia: Monitor Anesthesia Care

## 2023-02-20 NOTE — OR Nursing (Signed)
Daughter called to cancel case due to illness exposure with covid

## 2023-03-09 ENCOUNTER — Telehealth: Payer: Self-pay | Admitting: *Deleted

## 2023-03-09 NOTE — Telephone Encounter (Signed)
FYI

## 2023-03-09 NOTE — Telephone Encounter (Signed)
-----   Message from Anabel Bene sent at 03/09/2023 10:24 AM EDT ----- Pt's daughter called and states pt is at [facility] and they will do procedure there this week.  I have canceled procedure.

## 2023-03-11 ENCOUNTER — Encounter (HOSPITAL_COMMUNITY): Admission: RE | Admit: 2023-03-11 | Payer: 59 | Source: Ambulatory Visit

## 2023-03-16 ENCOUNTER — Ambulatory Visit (HOSPITAL_COMMUNITY): Admit: 2023-03-16 | Payer: 59

## 2023-03-16 ENCOUNTER — Encounter (HOSPITAL_COMMUNITY): Payer: Self-pay

## 2023-03-16 SURGERY — ESOPHAGOGASTRODUODENOSCOPY (EGD) WITH PROPOFOL
Anesthesia: Monitor Anesthesia Care

## 2023-04-01 ENCOUNTER — Telehealth: Payer: Self-pay | Admitting: Psychiatry

## 2023-04-01 NOTE — Telephone Encounter (Signed)
Patient's daughter came in office to notify that Jamie Salinas passed away on Saturday, 04/15/2023. She has no death certificate at this time but wanted the office to be aware and to cancel all appointments.

## 2023-04-01 NOTE — Telephone Encounter (Signed)
Noted. I was able to communicate with her daughter.

## 2023-04-12 DEATH — deceased

## 2023-04-22 ENCOUNTER — Telehealth: Payer: 59 | Admitting: Psychiatry
